# Patient Record
Sex: Female | Born: 1985 | Race: White | Hispanic: No | Marital: Married | Smoking: Never smoker
Health system: Southern US, Community
[De-identification: ages and names within clinical notes are randomized; demographics above are authoritative.]

## PROBLEM LIST (undated history)

## (undated) DIAGNOSIS — J45909 Unspecified asthma, uncomplicated: Secondary | ICD-10-CM

## (undated) DIAGNOSIS — N809 Endometriosis, unspecified: Secondary | ICD-10-CM

## (undated) DIAGNOSIS — R011 Cardiac murmur, unspecified: Secondary | ICD-10-CM

## (undated) DIAGNOSIS — R87619 Unspecified abnormal cytological findings in specimens from cervix uteri: Secondary | ICD-10-CM

## (undated) HISTORY — DX: Endometriosis, unspecified: N80.9

## (undated) HISTORY — PX: WISDOM TOOTH EXTRACTION: SHX21

## (undated) HISTORY — DX: Unspecified asthma, uncomplicated: J45.909

## (undated) HISTORY — DX: Cardiac murmur, unspecified: R01.1

## (undated) HISTORY — DX: Unspecified abnormal cytological findings in specimens from cervix uteri: R87.619

---
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
## 2007-06-09 ENCOUNTER — Encounter: Admission: RE | Admit: 2007-06-09 | Discharge: 2007-06-09 | Payer: Self-pay | Admitting: Internal Medicine

## 2010-09-13 NOTE — Op Note (Signed)
NAME:  Anne Hall, Anne Hall                         ACCOUNT NO.:  [id number]   MEDICAL RECORD NO.:  [id number]                   PATIENT TYPE:  AMB   LOCATION:  DSC                                  FACILITY:  [facility]   PHYSICIAN:  Hewitt Blade, D.D.S.             DATE OF BIRTH:  [birth year]   DATE OF PROCEDURE:  06/14/2002  DATE OF DISCHARGE:                                 OPERATIVE REPORT   PREOPERATIVE DIAGNOSES:  1. Maxillary and mandibular impacted third molar teeth #16, 17, 32.  2. History of asthma.   POSTOPERATIVE DIAGNOSES:  1. Maxillary and mandibular impacted third molar teeth #16, 17, 32.  2. History of asthma.   PROCEDURE:  Removal of third molar teeth #16, 17, 32.   SURGEON:  Hewitt Blade, D.D.S.   ASSISTANT:  Earlene Plater.   ANESTHESIA:  General via oroendotracheal intubation.   ESTIMATED BLOOD LOSS:  Less than 50 mL.   FLUIDS REPLACED:  Approximately 1000 mL crystalloid solution.   COMPLICATIONS:  None apparent.   INDICATION FOR PROCEDURE:  The patient is a 25 year old female who was  referred to my office for removal of her third molar teeth.  The patient was  attempted to have surgery in the office and was begun with nitrous oxide  oxygen sedation, then the patient became extremely asthmatic.  The procedure  was immediately terminated and the patient was rescheduled for day surgical  procedures where the airway could be properly maintained with intubation.   DESCRIPTION OF PROCEDURE:  The patient was taken to [facility] day  surgical center, where she was placed on the operating room table in a  supine position.  Following successful oroendotracheal intubation and  general anesthesia, the patient's face, neck, and oral cavity were prepped  and draped in the usual sterile operating room fashion.  The hypopharynx was  suctioned free of fluids and secretions and a moistened two-inch vaginal  pack was placed as a throat pack.  Attention was then directed  intraorally,  where approximately 10 mL of 0.5% Xylocaine containing 1:200,000 epinephrine  were infiltrated in the maxillary left posterior superior alveolar nerve  distribution, the corresponding palatal soft tissues, and the right and left  inferior alveolar neurovascular regions.  Attention was then directed toward  the left maxillary arch, where a #15 Bard Parker blade was used to create a  1.5 cm curvilinear incision through the alveolar mucosa.  A full-thickness  mucosal flap was elevated laterally and superiorly using a #9 Molt  periosteal elevator, exposing the lateral cortical plate of the posterior  maxilla.  A Stryker rotary osteotome with a #8 round bur was then used to  create an interosseous window, exposing embedded tooth #16.  The tooth was  then sectioned in its long axes using a 107 Fisher bur.  The tooth was then  subluxated from the alveolus and removed from the oral cavity using rongeurs  and cutting  forceps.  The surrounding dental follicular tissue was curetted  and removed using a double-ended Molt curette and rongeurs forceps.  The  bony margins were then smoothed and contoured with a small osseous file and  the surgical area copiously irrigated with sterile saline irrigating  solutions and suctioned. In a similar fashion, the lower third molar teeth  #17 and 32 were removed.  The mucoperiosteal margins were approximated and  sutured in an interrupted fashion using 4-0 chromic suture material.  The  patient was then allowed to awaken from the anesthesia following removal of  the throat pack and taken to the recovery room, where she tolerated the  procedure well without apparent complication.                                               Hewitt Blade, D.D.S.    DC/MEDQ  D:  06/14/2002  T:  06/14/2002  Job:  [id number]

## 2013-04-28 DIAGNOSIS — R87619 Unspecified abnormal cytological findings in specimens from cervix uteri: Secondary | ICD-10-CM

## 2013-04-28 HISTORY — DX: Unspecified abnormal cytological findings in specimens from cervix uteri: R87.619

## 2014-07-11 DIAGNOSIS — Z8742 Personal history of other diseases of the female genital tract: Secondary | ICD-10-CM | POA: Insufficient documentation

## 2015-02-05 ENCOUNTER — Encounter: Payer: Self-pay | Admitting: Physician Assistant

## 2015-02-05 ENCOUNTER — Ambulatory Visit: Payer: Self-pay | Admitting: Physician Assistant

## 2015-02-05 VITALS — BP 120/70 | HR 81 | Temp 99.2°F

## 2015-02-05 DIAGNOSIS — R509 Fever, unspecified: Secondary | ICD-10-CM

## 2015-02-05 DIAGNOSIS — J45990 Exercise induced bronchospasm: Secondary | ICD-10-CM

## 2015-02-05 LAB — POCT URINALYSIS DIPSTICK
Bilirubin, UA: NEGATIVE
Blood, UA: NEGATIVE
Glucose, UA: NEGATIVE
Ketones, UA: NEGATIVE
Leukocytes, UA: NEGATIVE
Nitrite, UA: NEGATIVE
Protein, UA: NEGATIVE
Spec Grav, UA: 1.01
Urobilinogen, UA: 0.2
pH, UA: 7

## 2015-02-05 MED ORDER — ALBUTEROL SULFATE HFA 108 (90 BASE) MCG/ACT IN AERS: 2.0000 | INHALATION_SPRAY | Freq: Four times a day (QID) | RESPIRATORY_TRACT | Status: DC | PRN

## 2015-02-05 NOTE — Progress Notes (Signed)
S: here for med refill, hx of exercise induced asthma, uses albuterol, no problems with asthma otherwise, also having some pelvic pain, was treated for uti few weeks ago, needs to see gyn  O: vitals wnl, nad, lungs c t a, cv rrr, ua wnl  A: exercise induced asthma, pelvic pain  P: f/u with gyn asap for pelvic pain, albuterol inhaler for asthma

## 2015-03-20 ENCOUNTER — Encounter: Payer: Self-pay | Admitting: Obstetrics and Gynecology

## 2015-10-23 ENCOUNTER — Ambulatory Visit: Payer: Self-pay | Admitting: Physician Assistant

## 2015-10-23 ENCOUNTER — Encounter: Payer: Self-pay | Admitting: Physician Assistant

## 2015-10-23 VITALS — BP 120/70 | HR 91 | Temp 99.6°F

## 2015-10-23 DIAGNOSIS — J029 Acute pharyngitis, unspecified: Secondary | ICD-10-CM

## 2015-10-23 MED ORDER — AZITHROMYCIN 250 MG PO TABS: ORAL_TABLET | ORAL | Status: DC

## 2015-10-23 NOTE — Progress Notes (Signed)
S: c/o sore throat, son has strep, she has a cold with congestion and cough for 3 days, throat started getting really sore overnight, has low grade temp, no tick bite  O: vitals w low grade temp, nad, tms clear, throat bright red, swollen, neck supple no lymph, lungs c t a, cv rrr  A: acute phayrngitis  P: zpack

## 2016-02-27 ENCOUNTER — Encounter: Payer: Self-pay | Admitting: Obstetrics and Gynecology

## 2016-02-27 ENCOUNTER — Ambulatory Visit (INDEPENDENT_AMBULATORY_CARE_PROVIDER_SITE_OTHER): Payer: Managed Care, Other (non HMO) | Admitting: Obstetrics and Gynecology

## 2016-02-27 VITALS — BP 131/81 | HR 93 | Ht 71.0 in | Wt 140.2 lb

## 2016-02-27 DIAGNOSIS — Z01419 Encounter for gynecological examination (general) (routine) without abnormal findings: Secondary | ICD-10-CM

## 2016-02-27 NOTE — Progress Notes (Signed)
ANNUAL PREVENTATIVE CARE GYN  ENCOUNTER NOTE  Subjective:       Anne Hall is a 30 y.o. G0P0000 female here for a routine annual gynecologic exam.  Current complaints: 1.  None  Patient has not been to the gynecologist in several years since her's retired. History of an abnormal pap smear, and then was told with the changes in protocol that pap would not be considered abnormal anymore. No new sexual partners, married and monogamous.   Menstrual history Menarche- 30 yo Interval- average 27 days Duration- 4 days Character: heavy for a couple days and then lighter Dysmenorrhea is mild cramping with occasional moderate burning cramps which she treats with advil    Gynecologic History Patient's last menstrual period was 02/21/2016 (exact date). Contraception: none Last Pap: 2014. Results were: normal Last mammogram: n/a. Results were: Marland Kitchen.  Obstetric History OB History  Gravida Para Term Preterm AB Living  0 0 0 0 0 0  SAB TAB Ectopic Multiple Live Births  0 0 0 0 0        Past Medical History:  Diagnosis Date  . Abnormal Pap smear of cervix 2015    Past Surgical History:  Procedure Laterality Date  . WISDOM TOOTH EXTRACTION      No current outpatient prescriptions on file prior to visit.   No current facility-administered medications on file prior to visit.     No Known Allergies  Social History   Social History  . Marital status: Married    Spouse name: N/A  . Number of children: N/A  . Years of education: N/A   Occupational History  . Not on file.   Social History Main Topics  . Smoking status: Never Smoker  . Smokeless tobacco: Not on file  . Alcohol use Not on file  . Drug use: Unknown  . Sexual activity: Not on file   Other Topics Concern  . Not on file   Social History Narrative  . No narrative on file    No family history on file.  The following portions of the patient's history were reviewed and updated as appropriate: allergies, current  medications, past family history, past medical history, past social history, past surgical history and problem list.  Review of Systems ROS Review of Systems - General ROS: negative for - chills, fatigue, fever, hot flashes, night sweats, weight gain or weight loss Psychological ROS: negative for - anxiety, decreased libido, depression, mood swings, physical abuse or sexual abuse Ophthalmic ROS: negative for - blurry vision, eye pain or loss of vision ENT ROS: negative for - headaches, hearing change, visual changes or vocal changes Allergy and Immunology ROS: negative for - hives, itchy/watery eyes or seasonal allergies Hematological and Lymphatic ROS: negative for - bleeding problems, bruising, swollen lymph nodes or weight loss Endocrine ROS: negative for - galactorrhea, hair pattern changes, hot flashes, malaise/lethargy, mood swings, palpitations, polydipsia/polyuria, skin changes, temperature intolerance or unexpected weight changes Breast ROS: negative for - new or changing breast lumps or nipple discharge Respiratory ROS: negative for - cough or shortness of breath Cardiovascular ROS: negative for - chest pain, irregular heartbeat, palpitations or shortness of breath Gastrointestinal ROS: no abdominal pain, change in bowel habits, or black or bloody stools Genito-Urinary ROS: no dysuria, trouble voiding, or hematuria Musculoskeletal ROS: negative for - joint pain or joint stiffness Neurological ROS: negative for - bowel and bladder control changes Dermatological ROS: negative for rash and skin lesion changes   Objective:   BP 131/81  Pulse 93   Ht 5\' 11"  (1.803 m)   Wt 140 lb 3.2 oz (63.6 kg)   LMP 02/21/2016 (Exact Date)   BMI 19.55 kg/m  CONSTITUTIONAL: Well-developed, well-nourished female in no acute distress.  PSYCHIATRIC: Normal mood and affect. Normal behavior. Normal judgment and thought content. NEUROLGIC: Alert and oriented to person, place, and time. Normal muscle  tone coordination. No cranial nerve deficit noted. HENT:  Normocephalic, atraumatic, External right and left ear normal. Oropharynx is clear and moist EYES: Conjunctivae and EOM are normal.  No scleral icterus.  NECK: Normal range of motion, supple, no masses.  Normal thyroid.  SKIN: Skin is warm and dry. No rash noted. Not diaphoretic. No erythema. No pallor. CARDIOVASCULAR: Normal heart rate noted, regular rhythm, no murmur. RESPIRATORY: Clear to auscultation bilaterally. Effort and breath sounds normal, no problems with respiration noted. BREASTS: Symmetric in size. No masses, skin changes, nipple drainage, or lymphadenopathy. ABDOMEN: Soft, normal bowel sounds, no distention noted.  No tenderness, rebound or guarding.  BLADDER: Normal PELVIC:  External Genitalia: Normal  BUS: Normal  Vagina: Normal  Cervix: Normal  Uterus: anteverted, soft, small, mobile and nontender  Adnexa: Normal; ovaries nonpalpable  LYMPHATIC: No Axillary, Supraclavicular, or Inguinal Adenopathy.    Assessment:   Annual gynecologic examination 30 y.o. 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 and unable to conceive, has not pursued infertility counseling, is currently taking a multivitamin with folic acid and open to pregnancy Family history of breast cancer: maternal grandmother- age 371 when diagnosed 19   Plan:  Pap: Pap Co Test Mammogram: Not Indicated Stool Guaiac Testing:  Not Indicated Labs: declines Routine preventative health maintenance measures emphasized: Exercise/Diet/Weight control, Tobacco Warnings, Alcohol/Substance use risks and Safe Sex  Continue taking MTV with folic acid while not using contraception  Return to Clinic - 1 Year   SunGardCrystal Miller, [place] Corena HerterAnna Parr, PA-S Herold HarmsMartin A Tamaka Sawin, MD   I have seen, interviewed, and examined the patient in conjunction with the [facility] [employer] and affirm the diagnosis and management  plan. Jidenna Figgs A. Georgette Helmer, MD, FACOG   Note: This dictation was prepared with Dragon dictation along with smaller phrase technology. Any transcriptional errors that result from this process are unintentional.

## 2016-02-28 DIAGNOSIS — J45990 Exercise induced bronchospasm: Secondary | ICD-10-CM | POA: Insufficient documentation

## 2016-02-28 NOTE — Patient Instructions (Signed)
Health Maintenance, Female Adopting a healthy lifestyle and getting preventive care can go a long way to promote health and wellness. Talk with your health care provider about what schedule of regular examinations is right for you. This is a good chance for you to check in with your provider about disease prevention and staying healthy. In between checkups, there are plenty of things you can do on your own. Experts have done a lot of research about which lifestyle changes and preventive measures are most likely to keep you healthy. Ask your health care provider for more information. WEIGHT AND DIET  Eat a healthy diet  Be sure to include plenty of vegetables, fruits, low-fat dairy products, and lean protein.  Do not eat a lot of foods high in solid fats, added sugars, or salt.  Get regular exercise. This is one of the most important things you can do for your health.  Most adults should exercise for at least 150 minutes each week. The exercise should increase your heart rate and make you sweat (moderate-intensity exercise).  Most adults should also do strengthening exercises at least twice a week. This is in addition to the moderate-intensity exercise.  Maintain a healthy weight  Body mass index (BMI) is a measurement that can be used to identify possible weight problems. It estimates body fat based on height and weight. Your health care provider can help determine your BMI and help you achieve or maintain a healthy weight.  For females 20 years of age and older:   A BMI below 18.5 is considered underweight.  A BMI of 18.5 to 24.9 is normal.  A BMI of 25 to 29.9 is considered overweight.  A BMI of 30 and above is considered obese.  Watch levels of cholesterol and blood lipids  You should start having your blood tested for lipids and cholesterol at 30 years of age, then have this test every 5 years.  You may need to have your cholesterol levels checked more often if:  Your lipid  or cholesterol levels are high.  You are older than 30 years of age.  You are at high risk for heart disease.  CANCER SCREENING   Lung Cancer  Lung cancer screening is recommended for adults 55-80 years old who are at high risk for lung cancer because of a history of smoking.  A yearly low-dose CT scan of the lungs is recommended for people who:  Currently smoke.  Have quit within the past 15 years.  Have at least a 30-pack-year history of smoking. A pack year is smoking an average of one pack of cigarettes a day for 1 year.  Yearly screening should continue until it has been 15 years since you quit.  Yearly screening should stop if you develop a health problem that would prevent you from having lung cancer treatment.  Breast Cancer  Practice breast self-awareness. This means understanding how your breasts normally appear and feel.  It also means doing regular breast self-exams. Let your health care provider know about any changes, no matter how small.  If you are in your 20s or 30s, you should have a clinical breast exam (CBE) by a health care provider every 1-3 years as part of a regular health exam.  If you are 40 or older, have a CBE every year. Also consider having a breast X-ray (mammogram) every year.  If you have a family history of breast cancer, talk to your health care provider about genetic screening.  If you   are at high risk for breast cancer, talk to your health care provider about having an MRI and a mammogram every year.  Breast cancer gene (BRCA) assessment is recommended for women who have family members with BRCA-related cancers. BRCA-related cancers include:  Breast.  Ovarian.  Tubal.  Peritoneal cancers.  Results of the assessment will determine the need for genetic counseling and BRCA1 and BRCA2 testing. Cervical Cancer Your health care provider may recommend that you be screened regularly for cancer of the pelvic organs (ovaries, uterus, and  vagina). This screening involves a pelvic examination, including checking for microscopic changes to the surface of your cervix (Pap test). You may be encouraged to have this screening done every 3 years, beginning at age 28.  For women ages 62-65, health care providers may recommend pelvic exams and Pap testing every 3 years, or they may recommend the Pap and pelvic exam, combined with testing for human papilloma virus (HPV), every 5 years. Some types of HPV increase your risk of cervical cancer. Testing for HPV may also be done on women of any age with unclear Pap test results.  Other health care providers may not recommend any screening for nonpregnant women who are considered low risk for pelvic cancer and who do not have symptoms. Ask your health care provider if a screening pelvic exam is right for you.  If you have had past treatment for cervical cancer or a condition that could lead to cancer, you need Pap tests and screening for cancer for at least 20 years after your treatment. If Pap tests have been discontinued, your risk factors (such as having a new sexual partner) need to be reassessed to determine if screening should resume. Some women have medical problems that increase the chance of getting cervical cancer. In these cases, your health care provider may recommend more frequent screening and Pap tests. Colorectal Cancer  This type of cancer can be detected and often prevented.  Routine colorectal cancer screening usually begins at 30 years of age and continues through 30 years of age.  Your health care provider may recommend screening at an earlier age if you have risk factors for colon cancer.  Your health care provider may also recommend using home test kits to check for hidden blood in the stool.  A small camera at the end of a tube can be used to examine your colon directly (sigmoidoscopy or colonoscopy). This is done to check for the earliest forms of colorectal  cancer.  Routine screening usually begins at age 86.  Direct examination of the colon should be repeated every 5-10 years through 30 years of age. However, you may need to be screened more often if early forms of precancerous polyps or small growths are found. Skin Cancer  Check your skin from head to toe regularly.  Tell your health care provider about any new moles or changes in moles, especially if there is a change in a mole's shape or color.  Also tell your health care provider if you have a mole that is larger than the size of a pencil eraser.  Always use sunscreen. Apply sunscreen liberally and repeatedly throughout the day.  Protect yourself by wearing long sleeves, pants, a wide-brimmed hat, and sunglasses whenever you are outside. HEART DISEASE, DIABETES, AND HIGH BLOOD PRESSURE   High blood pressure causes heart disease and increases the risk of stroke. High blood pressure is more likely to develop in:  People who have blood pressure in the high end  of the normal range (130-139/85-89 mm Hg).  People who are overweight or obese.  People who are African American.  If you are 38-23 years of age, have your blood pressure checked every 3-5 years. If you are 61 years of age or older, have your blood pressure checked every year. You should have your blood pressure measured twice--once when you are at a hospital or clinic, and once when you are not at a hospital or clinic. Record the average of the two measurements. To check your blood pressure when you are not at a hospital or clinic, you can use:  An automated blood pressure machine at a pharmacy.  A home blood pressure monitor.  If you are between 45 years and 39 years old, ask your health care provider if you should take aspirin to prevent strokes.  Have regular diabetes screenings. This involves taking a blood sample to check your fasting blood sugar level.  If you are at a normal weight and have a low risk for diabetes,  have this test once every three years after 30 years of age.  If you are overweight and have a high risk for diabetes, consider being tested at a younger age or more often. PREVENTING INFECTION  Hepatitis B  If you have a higher risk for hepatitis B, you should be screened for this virus. You are considered at high risk for hepatitis B if:  You were born in a country where hepatitis B is common. Ask your health care provider which countries are considered high risk.  Your parents were born in a high-risk country, and you have not been immunized against hepatitis B (hepatitis B vaccine).  You have HIV or AIDS.  You use needles to inject street drugs.  You live with someone who has hepatitis B.  You have had sex with someone who has hepatitis B.  You get hemodialysis treatment.  You take certain medicines for conditions, including cancer, organ transplantation, and autoimmune conditions. Hepatitis C  Blood testing is recommended for:  Everyone born from [birth year] through 1965.  Anyone with known risk factors for hepatitis C. Sexually transmitted infections (STIs)  You should be screened for sexually transmitted infections (STIs) including gonorrhea and chlamydia if:  You are sexually active and are younger than 30 years of age.  You are older than 30 years of age and your health care provider tells you that you are at risk for this type of infection.  Your sexual activity has changed since you were last screened and you are at an increased risk for chlamydia or gonorrhea. Ask your health care provider if you are at risk.  If you do not have HIV, but are at risk, it may be recommended that you take a prescription medicine daily to prevent HIV infection. This is called pre-exposure prophylaxis (PrEP). You are considered at risk if:  You are sexually active and do not regularly use condoms or know the HIV status of your partner(s).  You take drugs by injection.  You are sexually  active with a partner who has HIV. Talk with your health care provider about whether you are at high risk of being infected with HIV. If you choose to begin PrEP, you should first be tested for HIV. You should then be tested every 3 months for as long as you are taking PrEP.  PREGNANCY   If you are premenopausal and you may become pregnant, ask your health care provider about preconception counseling.  If you may  become pregnant, take 400 to 800 micrograms (mcg) of folic acid every day.  If you want to prevent pregnancy, talk to your health care provider about birth control (contraception). OSTEOPOROSIS AND MENOPAUSE   Osteoporosis is a disease in which the bones lose minerals and strength with aging. This can result in serious bone fractures. Your risk for osteoporosis can be identified using a bone density scan.  If you are 61 years of age or older, or if you are at risk for osteoporosis and fractures, ask your health care provider if you should be screened.  Ask your health care provider whether you should take a calcium or vitamin D supplement to lower your risk for osteoporosis.  Menopause may have certain physical symptoms and risks.  Hormone replacement therapy may reduce some of these symptoms and risks. Talk to your health care provider about whether hormone replacement therapy is right for you.  HOME CARE INSTRUCTIONS   Schedule regular health, dental, and eye exams.  Stay current with your immunizations.   Do not use any tobacco products including cigarettes, chewing tobacco, or electronic cigarettes.  If you are pregnant, do not drink alcohol.  If you are breastfeeding, limit how much and how often you drink alcohol.  Limit alcohol intake to no more than 1 drink per day for nonpregnant women. One drink equals 12 ounces of beer, 5 ounces of wine, or 1 ounces of hard liquor.  Do not use street drugs.  Do not share needles.  Ask your health care provider for help if  you need support or information about quitting drugs.  Tell your health care provider if you often feel depressed.  Tell your health care provider if you have ever been abused or do not feel safe at home.   This information is not intended to replace advice given to you by your health care provider. Make sure you discuss any questions you have with your health care provider.   Document Released: 10/28/2010 Document Revised: 05/05/2014 Document Reviewed: 03/16/2013 Elsevier Interactive Patient Education [employer].

## 2016-02-29 ENCOUNTER — Telehealth: Payer: Self-pay | Admitting: Obstetrics and Gynecology

## 2016-02-29 NOTE — Telephone Encounter (Signed)
PT CALLED AND SHE SAW DR DE AND HE TOLD HER THAT HER HUSBAND NEEDED TO BE TESTED FOR FERTILITY AND SHEW WASN'T SURE WHERE HE NEEDED TO GO AND GT THAT DONE AND IF IT WAS BLOOD OR SEMEN.

## 2016-03-03 LAB — PAP IG AND HPV HIGH-RISK
HPV, high-risk: NEGATIVE
PAP Smear Comment: 0

## 2016-03-03 NOTE — Telephone Encounter (Signed)
Lmtrc

## 2016-03-04 NOTE — Telephone Encounter (Signed)
Pt aware per mad- husband to complete semen analysis. All info explained and left up front for p/u.

## 2016-10-09 ENCOUNTER — Ambulatory Visit: Payer: Self-pay | Admitting: Physician Assistant

## 2016-10-09 ENCOUNTER — Encounter: Payer: Self-pay | Admitting: Physician Assistant

## 2016-10-09 VITALS — BP 100/70 | HR 68 | Temp 98.5°F | Resp 16

## 2016-10-09 DIAGNOSIS — R109 Unspecified abdominal pain: Secondary | ICD-10-CM

## 2016-10-09 DIAGNOSIS — Z299 Encounter for prophylactic measures, unspecified: Secondary | ICD-10-CM

## 2016-10-09 LAB — POCT URINALYSIS DIPSTICK
Bilirubin, UA: NEGATIVE
Blood, UA: NEGATIVE
Glucose, UA: NEGATIVE
Ketones, UA: NEGATIVE
Leukocytes, UA: NEGATIVE
Nitrite, UA: NEGATIVE
Protein, UA: NEGATIVE
Spec Grav, UA: 1.01 (ref 1.010–1.025)
Urobilinogen, UA: 0.2 E.U./dL
pH, UA: 7 (ref 5.0–8.0)

## 2016-10-09 NOTE — Progress Notes (Signed)
   Subjective:Righ flank pain    Patient ID: Anne Hall, female    DOB: 1985/09/03, 31 y.o.   MRN: [id number]  HPI Patient c/o right intermitting right flank pain for 1 1/2 weeks. Denies urinary compliant.Denies fever or vaginal discharge. LMP one week ago. States only provocative incident is carrying chil car seat for 415 month old infant. No radicular compliant.   Review of Systems    Negative except for compliant. Objective:   Physical Exam No obvious spinal deformity. No CVA guarding. Increased c/o pain with left lateral movements. Dip UA unremarkable.       Assessment & Plan:Left flank pain.  Discussed conservative measure at this time. Follow up if compliant presists.

## 2017-02-04 ENCOUNTER — Ambulatory Visit: Payer: Self-pay | Admitting: Physician Assistant

## 2017-02-04 ENCOUNTER — Encounter: Payer: Self-pay | Admitting: Physician Assistant

## 2017-02-04 VITALS — BP 119/62 | HR 70 | Temp 98.5°F | Resp 16

## 2017-02-04 DIAGNOSIS — Z0289 Encounter for other administrative examinations: Secondary | ICD-10-CM

## 2017-02-04 NOTE — Progress Notes (Signed)
Pt here to have form filled out for foster care.  No chronic problems, no tb symptoms, is not immunocompromised, is in good health, states she is renewing her license  O: vitals wnl, nad, lungs c t a, cv rrr  A: well adult  P: filled out form for pt, rma to scan form

## 2017-08-27 ENCOUNTER — Ambulatory Visit: Payer: Self-pay | Admitting: Family Medicine

## 2017-08-27 VITALS — BP 125/62 | HR 68 | Resp 15 | Ht 71.0 in | Wt 143.0 lb

## 2017-08-27 DIAGNOSIS — Z008 Encounter for other general examination: Secondary | ICD-10-CM

## 2017-08-27 DIAGNOSIS — J45909 Unspecified asthma, uncomplicated: Secondary | ICD-10-CM

## 2017-08-27 DIAGNOSIS — R221 Localized swelling, mass and lump, neck: Secondary | ICD-10-CM

## 2017-08-27 DIAGNOSIS — Z0189 Encounter for other specified special examinations: Principal | ICD-10-CM

## 2017-08-27 LAB — GLUCOSE, POCT (MANUAL RESULT ENTRY): POC Glucose: 84 mg/dl (ref 70–99)

## 2017-08-27 MED ORDER — ALBUTEROL SULFATE HFA 108 (90 BASE) MCG/ACT IN AERS: 2.0000 | INHALATION_SPRAY | Freq: Four times a day (QID) | RESPIRATORY_TRACT | 1 refills | Status: DC | PRN

## 2017-08-27 NOTE — Progress Notes (Signed)
Subjective: Annual biometrics screening  Patient presents for her annual biometric screening. Patient reports that her only medical history is asthma and is requesting a refill for her albuterol inhaler.  Patient reports she only uses her albuterol inhaler prior to running.  Patient denies using this at all otherwise.  Patient does not currently have a primary care provider.  Patient denies any limitations related to asthma or any history of asthma exacerbations.  Patient also reports noting a bump to the right side of her neck for the last 1 to 2 months.  Patient reports having an upper respiratory infection prior to this but is unsure if it coincided with the onset of the bump to the right side of her neck.  Patient denies any tenderness or change in this nodule.  Reports is remained stable.  Patient denies any other nodules or symptoms.  Patient denies any relevant infections, travel, or medical history.  Patient denies fever, chills, weight loss, fatigue, malaise, or any other systemic symptoms.  Denies any history of cancer. Patient denies any other issues or concerns.   Review of Systems Unremarkable  Objective  Physical Exam General: Awake, alert and oriented. No acute distress. Well developed, hydrated and nourished. Appears stated age.  HEENT: Supple neck without adenopathy. Sclera is non-icteric. The ear canal is clear without discharge. The tympanic membrane is normal in appearance with normal landmarks and cone of light. Nasal mucosa is pink and moist. Oral mucosa is pink and moist. The pharynx is normal in appearance without tonsillar swelling or exudates.  Skin: Skin in warm, dry and intact without rashes or lesions. Appropriate color for ethnicity.  Small soft, mobile nodule approximately half a centimeter in diameter along the right sternocleidomastoid muscle overlying anterior cervical lymph chain.  Nontender to palpation.  No erythema, edema, warmth to the touch, fluctuance, or  concerning findings noted. Cardiac: Heart rate and rhythm are normal. No murmurs, gallops, or rubs are auscultated.  Respiratory: The chest wall is symmetric and without deformity. No signs of respiratory distress. Lung sounds are clear in all lobes bilaterally without rales, ronchi, or wheezes.  Neurological: The patient is awake, alert and oriented to person, place, and time with normal speech.  Memory is normal and thought processes intact. No gait abnormalities are appreciated.  Psychiatric: Appropriate mood and affect.   Assessment Annual biometrics screening  Plan  Lipid panel pending. Encouraged routine visits with primary care provider.  Encourage patient to establish care with a new primary care provider. Fasting blood sugar is 84 today. Ultrasound ordered to assess nodule to right neck.  Patient says she is unable to get this done until next week.

## 2017-08-28 LAB — LIPID PANEL
Chol/HDL Ratio: 2.4 ratio (ref 0.0–4.4)
Cholesterol, Total: 141 mg/dL (ref 100–199)
HDL: 60 mg/dL (ref >39)
LDL Calculated: 72 mg/dL (ref 0–99)
Triglycerides: 43 mg/dL (ref 0–149)
VLDL Cholesterol Cal: 9 mg/dL (ref 5–40)

## 2017-09-03 ENCOUNTER — Ambulatory Visit
Admission: RE | Admit: 2017-09-03 | Discharge: 2017-09-03 | Disposition: A | Discharge status: Home / Self Care | Payer: Managed Care, Other (non HMO) | Source: Ambulatory Visit | Attending: Family Medicine | Admitting: Family Medicine

## 2017-09-03 DIAGNOSIS — R221 Localized swelling, mass and lump, neck: Secondary | ICD-10-CM | POA: Insufficient documentation

## 2017-09-03 NOTE — Progress Notes (Signed)
Called and spoke with the patient and informed her of her ultrasound results.  Patient reports the nodule is unchanged and denies any new symptoms.  Advised patient to call make an appointment with a new primary care provider today for an appointment within the next month to establish care and to follow-up with her primary care provider at this first visit if the nodule is still present.  Advised ptient to return to the clinic if she notices any changes, develops new symptoms, or has any concerns.

## 2018-02-05 ENCOUNTER — Encounter: Payer: Self-pay | Admitting: Family Medicine

## 2018-02-05 ENCOUNTER — Ambulatory Visit (INDEPENDENT_AMBULATORY_CARE_PROVIDER_SITE_OTHER): Payer: Managed Care, Other (non HMO) | Admitting: Family Medicine

## 2018-02-05 VITALS — BP 116/80 | HR 64 | Temp 98.8°F | Ht 69.5 in | Wt 146.6 lb

## 2018-02-05 DIAGNOSIS — J4530 Mild persistent asthma, uncomplicated: Secondary | ICD-10-CM | POA: Diagnosis not present

## 2018-02-05 MED ORDER — ALBUTEROL SULFATE HFA 108 (90 BASE) MCG/ACT IN AERS: 2.0000 | INHALATION_SPRAY | Freq: Four times a day (QID) | RESPIRATORY_TRACT | 5 refills | Status: AC | PRN

## 2018-02-05 MED ORDER — MONTELUKAST SODIUM 10 MG PO TABS: 10.0000 mg | ORAL_TABLET | Freq: Every day | ORAL | 3 refills | Status: DC

## 2018-02-05 MED ORDER — FLUTICASONE-SALMETEROL 100-50 MCG/DOSE IN AEPB: 1.0000 | INHALATION_SPRAY | Freq: Two times a day (BID) | RESPIRATORY_TRACT | 5 refills | Status: DC

## 2018-02-05 NOTE — Progress Notes (Signed)
Subjective:    Patient ID: Anne Hall, female    DOB: 11/08/1985, 32 y.o.   MRN: [id number]  HPI   Patient presents to clinic to establish primary care and also needs asthma medication adjustment.  Currently she uses albuterol inhaler as needed, but has noticed she is been needing it more.  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  Notices her breathing seems worse when running outdoors, so has been using treadmill indoors more. She is training for a marathon that will take place in March 2020 and wants to be sure she is able to run outdoors. States she did take Advair for a period of time when in highschool, but then stopped taking it in college.    Patient Active Problem List   Diagnosis Date Noted  . Exercise-induced asthma 02/28/2016  . History of infertility 07/11/2014   Past Medical History:  Diagnosis Date  . Abnormal Pap smear of cervix 2015  . Asthma   . Endometriosis   . Heart murmur    Past Surgical History:  Procedure Laterality Date  . WISDOM TOOTH EXTRACTION     Social History   Tobacco Use  . Smoking status: Never Smoker  . Smokeless tobacco: Never Used  Substance Use Topics  . Alcohol use: Not Currently    Alcohol/week: 0.0 standard drinks    Comment: wine- 2 x week   Family History  Problem Relation Age of Onset  . Cancer Maternal Grandmother   . Heart disease Maternal Grandmother   . Miscarriages / [place] Mother   . Hyperlipidemia Mother   . Hypertension Mother   . Asthma Father   . Arthritis Paternal Grandmother   . Kidney disease Paternal Grandmother   . Alcohol abuse Paternal Grandfather   . Early death Paternal Grandfather   . Miscarriages / Stillbirths Sister   . Diabetes Neg Hx     Review of Systems    Constitutional: Negative for chills, fatigue and fever.  HENT: Negative for congestion, ear pain, sinus pain and sore throat.   Eyes: Negative.   Respiratory: Negative for cough. +SOB and wheeze, esp when running.     Cardiovascular: Negative for chest pain, palpitations and leg swelling.  Gastrointestinal: Negative for abdominal pain, diarrhea, nausea and vomiting.  Genitourinary: Negative for dysuria, frequency and urgency.  Musculoskeletal: Negative for arthralgias and myalgias.  Skin: Negative for color change, pallor and rash.  Neurological: Negative for syncope, light-headedness and headaches.  Psychiatric/Behavioral: The patient is not nervous/anxious.       Objective:   Physical Exam  Constitutional: She is oriented to person, place, and time. She appears well-developed and well-nourished. No distress.  Eyes: Conjunctivae and EOM are normal. No scleral icterus.  Neck: Neck supple. No tracheal deviation present.  Cardiovascular: Normal rate and regular rhythm.  Pulmonary/Chest: Effort normal and breath sounds normal. No respiratory distress. She has no wheezes. She has no rales.  Musculoskeletal: She exhibits no edema.  Gait normal.   Neurological: She is alert and oriented to person, place, and time.  Skin: Skin is warm and dry. Capillary refill takes less than 2 seconds. No pallor.  Psychiatric: She has a normal mood and affect. Her behavior is normal.  Nursing note and vitals reviewed.     Vitals:   02/05/18 0957  BP: 116/80  Pulse: 64  Temp: 98.8 F (37.1 C)  SpO2: 99%    Assessment & Plan:   Mild persistent asthma - patient will continue to use  albuterol as needed.  We will also add an Advair 1 puff 2 times daily (patient has had success with this medication in past).  Due to patient running outside being a trigger for her asthma symptoms, I suspect allergens are triggering her symptoms.  She will begin Singulair 10 mg once daily to help combat this.  Patient declines flu vaccine in clinic today.  She declines tetanus vaccine in clinic today.  She will follow up in 4 weeks for recheck on breathing after medication additions.

## 2018-03-05 ENCOUNTER — Ambulatory Visit: Payer: Managed Care, Other (non HMO) | Admitting: Family Medicine

## 2018-03-10 ENCOUNTER — Encounter: Payer: Self-pay | Admitting: Family Medicine

## 2018-03-10 ENCOUNTER — Ambulatory Visit (INDEPENDENT_AMBULATORY_CARE_PROVIDER_SITE_OTHER): Payer: Managed Care, Other (non HMO) | Admitting: Family Medicine

## 2018-03-10 VITALS — BP 118/80 | HR 71 | Temp 98.3°F | Ht 70.0 in | Wt 144.0 lb

## 2018-03-10 DIAGNOSIS — Z0189 Encounter for other specified special examinations: Secondary | ICD-10-CM

## 2018-03-10 DIAGNOSIS — J4520 Mild intermittent asthma, uncomplicated: Secondary | ICD-10-CM

## 2018-03-10 DIAGNOSIS — Z008 Encounter for other general examination: Secondary | ICD-10-CM

## 2018-03-10 LAB — LIPID PANEL
Cholesterol: 158 mg/dL (ref 0–200)
HDL: 59.5 mg/dL (ref >39.00)
LDL Cholesterol: 89 mg/dL (ref 0–99)
NonHDL: 98.69
Total CHOL/HDL Ratio: 3
Triglycerides: 49 mg/dL (ref 0.0–149.0)
VLDL: 9.8 mg/dL (ref 0.0–40.0)

## 2018-03-10 LAB — GLUCOSE, POCT (MANUAL RESULT ENTRY): POC Glucose: 138 mg/dl — AB (ref 70–99)

## 2018-03-10 NOTE — Progress Notes (Signed)
Subjective:    Patient ID: Anne Hall, female    DOB: [birth year], 32 y.o.   MRN: [id number]  HPI  Presents to clinic for biometric screening for her insurance through her employer.  Patient is feeling very well.  Ever since the addition of Advair, she has not needed to use her albuterol inhaler at all in the past 4 weeks, even when doing a trail run.  Patient stopped the Singulair, states she did not like how it made her feel.  Patient does not require Pap smear at this time.  Does have a family history of breast cancer in her grandmother, but she was diagnosed at age 15.  Patient does do self breast exams, has no abnormalities.  Menses are regular.  Patient follows a healthy diet, she gets regular exercise.  She sees dentist 2 times per year, sees eye doctor every 1 to 2 years.   Patient Active Problem List   Diagnosis Date Noted  . Exercise-induced asthma 02/28/2016  . History of infertility 07/11/2014   Past Medical History:  Diagnosis Date  . Abnormal Pap smear of cervix 2015  . Asthma   . Endometriosis   . Heart murmur    Social History   Tobacco Use  . Smoking status: Never Smoker  . Smokeless tobacco: Never Used  Substance Use Topics  . Alcohol use: Not Currently    Alcohol/week: 0.0 standard drinks    Comment: wine- 2 x week   Past Surgical History:  Procedure Laterality Date  . WISDOM TOOTH EXTRACTION     Family History  Problem Relation Age of Onset  . Cancer Maternal Grandmother   . Heart disease Maternal Grandmother   . Miscarriages / [place] Mother   . Hyperlipidemia Mother   . Hypertension Mother   . Asthma Father   . Arthritis Paternal Grandmother   . Kidney disease Paternal Grandmother   . Alcohol abuse Paternal Grandfather   . Early death Paternal Grandfather   . Miscarriages / Stillbirths Sister   . Diabetes Neg Hx     Review of Systems  Constitutional: Negative for chills, fatigue and fever.  HENT: Negative for congestion,  ear pain, sinus pain and sore throat.   Eyes: Negative.   Respiratory: Negative for cough, shortness of breath and wheezing.   Cardiovascular: Negative for chest pain, palpitations and leg swelling.  Gastrointestinal: Negative for abdominal pain, diarrhea, nausea and vomiting.  Genitourinary: Negative for dysuria, frequency and urgency.  Musculoskeletal: Negative for arthralgias and myalgias.  Skin: Negative for color change, pallor and rash.  Neurological: Negative for syncope, light-headedness and headaches.  Psychiatric/Behavioral: The patient is not nervous/anxious.       Objective:   Physical Exam  Constitutional: She appears well-developed and well-nourished. No distress.  HENT:  Head: Normocephalic and atraumatic.  Eyes: Pupils are equal, round, and reactive to light. EOM are normal. No scleral icterus.  Neck: Normal range of motion. Neck supple. No tracheal deviation present.  Cardiovascular: Normal rate, regular rhythm and normal heart sounds.  Pulmonary/Chest: Effort normal and breath sounds normal. No respiratory distress. She has no wheezes. She has no rales.  Abdominal: Soft. Bowel sounds are normal. There is no tenderness.  Neurological: She is alert and oriented to person, place, and time.  Gait normal  Skin: Skin is warm and dry. No pallor.  Psychiatric: She has a normal mood and affect. Her behavior is normal. Thought content normal.   Nursing note and vitals reviewed.    Vitals:  03/10/18 0823  BP: 118/80  Pulse: 71  Temp: 98.3 F (36.8 C)  SpO2: 98%   Assessment & Plan:   Encounter for biometric screening- patient only would like lipid panel drawn in clinic today, this is the only lab test as required for the screening.  We will get this test ordered for her.  Biometric screening also requires fingerstick blood sugar, her result from our point-of-care testing in clinic is 138 (she did have small breakfast this AM before appt).  Asthma, mild - patient has  done very well with addition of Advair.  She will continue to take this, and also have albuterol inhaler to use as needed if she has any breakthrough shortness of breath or wheezing symptoms.  Patient will follow-up in 6 months for recheck on asthma.  She is aware she can return to clinic at any time if issues arise.

## 2018-03-11 ENCOUNTER — Telehealth: Payer: Self-pay | Admitting: Family Medicine

## 2018-03-11 NOTE — Telephone Encounter (Signed)
Copied from CRM [id number]. Topic: Quick Communication - Lab Results (Clinic Use ONLY) >> Mar 11, 2018  2:15 PM Clearnce SorrelPickard, Jill S, [place] wrote: Called patient to inform them of 03/09/2018 lab results. When patient returns call, triage nurse may disclose results.

## 2018-03-11 NOTE — Telephone Encounter (Signed)
See result notes. 

## 2018-05-28 ENCOUNTER — Telehealth: Payer: Self-pay | Admitting: Family Medicine

## 2018-05-28 NOTE — Telephone Encounter (Signed)
Pt left a form to be filled out for work. Form is in the front in [facility]'s folder.

## 2018-05-31 NOTE — Telephone Encounter (Signed)
Have you seen this form?

## 2018-06-01 NOTE — Telephone Encounter (Signed)
Picked up form from front

## 2018-07-01 ENCOUNTER — Ambulatory Visit: Payer: Managed Care, Other (non HMO) | Admitting: Adult Health

## 2018-07-01 ENCOUNTER — Other Ambulatory Visit: Payer: Self-pay

## 2018-07-01 ENCOUNTER — Encounter: Payer: Self-pay | Admitting: Adult Health

## 2018-07-01 VITALS — BP 122/78 | HR 69 | Temp 98.9°F | Resp 14 | Ht 71.0 in | Wt 140.0 lb

## 2018-07-01 DIAGNOSIS — J45909 Unspecified asthma, uncomplicated: Secondary | ICD-10-CM

## 2018-07-01 DIAGNOSIS — J020 Streptococcal pharyngitis: Secondary | ICD-10-CM

## 2018-07-01 LAB — POCT RAPID STREP A (OFFICE): Rapid Strep A Screen: POSITIVE — AB

## 2018-07-01 MED ORDER — CETIRIZINE HCL 10 MG PO TABS: 10.0000 mg | ORAL_TABLET | Freq: Every day | ORAL | 3 refills | Status: DC

## 2018-07-01 MED ORDER — AMOXICILLIN 875 MG PO TABS: 875.0000 mg | ORAL_TABLET | Freq: Two times a day (BID) | ORAL | 0 refills | Status: DC

## 2018-07-01 MED ORDER — PREDNISONE 10 MG (21) PO TBPK: ORAL_TABLET | ORAL | 0 refills | Status: DC

## 2018-07-01 MED ORDER — FLUTICASONE PROPIONATE 50 MCG/ACT NA SUSP: 2.0000 | Freq: Every day | NASAL | 3 refills | Status: DC

## 2018-07-01 NOTE — Patient Instructions (Addendum)
Prednisolone tablets What is this medicine? PREDNISOLONE (pred NISS oh lone) is a corticosteroid. It is commonly used to treat inflammation of the skin, joints, lungs, and other organs. Common conditions treated include asthma, allergies, and arthritis. It is also used for other conditions, such as blood disorders and diseases of the adrenal glands. This medicine may be used for other purposes; ask your health care provider or pharmacist if you have questions. COMMON BRAND NAME(S): Millipred, Millipred DP, Millipred DP 12-Day, Millipred DP 6 Day, Prednoral What should I tell my health care provider before I take this medicine? They need to know if you have any of these conditions: -Cushing's syndrome -diabetes -glaucoma -heart problems or disease -high blood pressure -infection such as herpes, measles, tuberculosis, or chickenpox -kidney disease -liver disease -mental problems -myasthenia gravis -osteoporosis -seizures -stomach ulcer or intestine disease including colitis and diverticulitis -thyroid problem -an unusual or allergic reaction to lactose, prednisolone, other medicines, foods, dyes, or preservatives -pregnant or trying to get pregnant -breast-feeding How should I use this medicine? Take this medicine by mouth with a glass of water. Follow the directions on the prescription label. Take it with food or milk to avoid stomach upset. If you are taking this medicine once a day, take it in the morning. Do not take more medicine than you are told to take. Do not suddenly stop taking your medicine because you may develop a severe reaction. Your doctor will tell you how much medicine to take. If your doctor wants you to stop the medicine, the dose may be slowly lowered over time to avoid any side effects. Talk to your pediatrician regarding the use of this medicine in children. Special care may be needed. Overdosage: If you think you have taken too much of this medicine contact a poison  control center or emergency room at once. NOTE: This medicine is only for you. Do not share this medicine with others. What if I miss a dose? If you miss a dose, take it as soon as you can. If it is almost time for your next dose, take only that dose. Do not take double or extra doses. What may interact with this medicine? Do not take this medicine with any of the following medications: -metyrapone -mifepristone This medicine may also interact with the following medications: -aminoglutethimide -amphotericin B -aspirin and aspirin-like medicines -barbiturates -certain medicines for diabetes, like glipizide or glyburide -cholestyramine -cholinesterase inhibitors -cyclosporine -digoxin -diuretics -ephedrine -female hormones, like estrogens and birth control pills -isoniazid -ketoconazole -NSAIDS, medicines for pain and inflammation, like ibuprofen or naproxen -phenytoin -rifampin -toxoids -vaccines -warfarin This list may not describe all possible interactions. Give your health care provider a list of all the medicines, herbs, non-prescription drugs, or dietary supplements you use. Also tell them if you smoke, drink alcohol, or use illegal drugs. Some items may interact with your medicine. What should I watch for while using this medicine? Visit your doctor or health care professional for regular checks on your progress. If you are taking this medicine over a prolonged period, carry an identification card with your name and address, the type and dose of your medicine, and your doctor's name and address. This medicine may increase your risk of getting an infection. Tell your doctor or health care professional if you are around anyone with measles or chickenpox, or if you develop sores or blisters that do not heal properly. If you are going to have surgery, tell your doctor or health care professional that you have taken  this medicine within the last twelve months. Ask your doctor or  health care professional about your diet. You may need to lower the amount of salt you eat. This medicine may affect blood sugar levels. If you have diabetes, check with your doctor or health care professional before you change your diet or the dose of your diabetic medicine. What side effects may I notice from receiving this medicine? Side effects that you should report to your doctor or health care professional as soon as possible: -allergic reactions like skin rash, itching or hives, swelling of the face, lips, or tongue -changes in emotions or moods -changes in vision -eye pain -signs and symptoms of high blood sugar such as dizziness; dry mouth; dry skin; fruity breath; nausea; stomach pain; increased hunger or thirst; increased urination -signs and symptoms of infection like fever or chills; cough; sore throat; pain or trouble passing urine -slow growth in children (if used for longer periods of time) -swelling of ankles, feet -trouble sleeping -unusually weak or tired -weak bones (if used for longer periods of time) Side effects that usually do not require medical attention (report to your doctor or health care professional if they continue or are bothersome): -increased hunger -nausea -skin problems, acne, thin and shiny skin -upset stomach -weight gain This list may not describe all possible side effects. Call your doctor for medical advice about side effects. You may report side effects to FDA at 1-800-FDA-1088. Where should I keep my medicine? Keep out of the reach of children. Store at room temperature between 15 and 30 degrees C (59 and 86 degrees F). Keep container tightly closed. Throw away any unused medicine after the expiration date. NOTE: This sheet is a summary. It may not cover all possible information. If you have questions about this medicine, talk to your doctor, pharmacist, or health care provider.  2019 Elsevier/Gold Standard (2015-05-17 12:30:30) Amoxicillin  capsules or tablets What is this medicine? AMOXICILLIN (a mox i SIL in) is a penicillin antibiotic. It is used to treat certain kinds of bacterial infections. It will not work for colds, flu, or other viral infections. This medicine may be used for other purposes; ask your health care provider or pharmacist if you have questions. COMMON BRAND NAME(S): Amoxil, Moxilin, Sumox, Trimox What should I tell my health care provider before I take this medicine? They need to know if you have any of these conditions: -asthma -kidney disease -an unusual or allergic reaction to amoxicillin, other penicillins, cephalosporin antibiotics, other medicines, foods, dyes, or preservatives -pregnant or trying to get pregnant -breast-feeding How should I use this medicine? Take this medicine by mouth with a glass of water. Follow the directions on your prescription label. You may take this medicine with food or on an empty stomach. Take your medicine at regular intervals. Do not take your medicine more often than directed. Take all of your medicine as directed even if you think your are better. Do not skip doses or stop your medicine early. Talk to your pediatrician regarding the use of this medicine in children. While this drug may be prescribed for selected conditions, precautions do apply. Overdosage: If you think you have taken too much of this medicine contact a poison control center or emergency room at once. NOTE: This medicine is only for you. Do not share this medicine with others. What if I miss a dose? If you miss a dose, take it as soon as you can. If it is almost time for your next  dose, take only that dose. Do not take double or extra doses. What may interact with this medicine? -amiloride -birth control pills -chloramphenicol -macrolides -probenecid -sulfonamides -tetracyclines This list may not describe all possible interactions. Give your health care provider a list of all the medicines, herbs,  non-prescription drugs, or dietary supplements you use. Also tell them if you smoke, drink alcohol, or use illegal drugs. Some items may interact with your medicine. What should I watch for while using this medicine? Tell your doctor or health care professional if your symptoms do not improve in 2 or 3 days. Take all of the doses of your medicine as directed. Do not skip doses or stop your medicine early. If you are diabetic, you may get a false positive result for sugar in your urine with certain brands of urine tests. Check with your doctor. Do not treat diarrhea with over-the-counter products. Contact your doctor if you have diarrhea that lasts more than 2 days or if the diarrhea is severe and watery. What side effects may I notice from receiving this medicine? Side effects that you should report to your doctor or health care professional as soon as possible: -allergic reactions like skin rash, itching or hives, swelling of the face, lips, or tongue -breathing problems -dark urine -redness, blistering, peeling or loosening of the skin, including inside the mouth -seizures -severe or watery diarrhea -trouble passing urine or change in the amount of urine -unusual bleeding or bruising -unusually weak or tired -yellowing of the eyes or skin Side effects that usually do not require medical attention (report to your doctor or health care professional if they continue or are bothersome): -dizziness -headache -stomach upset -trouble sleeping This list may not describe all possible side effects. Call your doctor for medical advice about side effects. You may report side effects to FDA at 1-800-FDA-1088. Where should I keep my medicine? Keep out of the reach of children. Store between 68 and 77 degrees F (20 and 25 degrees C). Keep bottle closed tightly. Throw away any unused medicine after the expiration date. NOTE: This sheet is a summary. It may not cover all possible information. If you have  questions about this medicine, talk to your doctor, pharmacist, or health care provider.  2019 Elsevier/Gold Standard (2007-07-06 14:10:59) Pharyngitis  Pharyngitis is a sore throat (pharynx). This is when there is redness, pain, and swelling in your throat. Most of the time, this condition gets better on its own. In some cases, you may need medicine. Follow these instructions at home:  Take over-the-counter and prescription medicines only as told by your doctor. ? If you were prescribed an antibiotic medicine, take it as told by your doctor. Do not stop taking the antibiotic even if you start to feel better. ? Do not give children aspirin. Aspirin has been linked to Reye syndrome.  Drink enough water and fluids to keep your pee (urine) clear or pale yellow.  Get a lot of rest.  Rinse your mouth (gargle) with a salt-water mixture 3-4 times a day or as needed. To make a salt-water mixture, completely dissolve -1 tsp of salt in 1 cup of warm water.  If your doctor approves, you may use throat lozenges or sprays to soothe your throat. Contact a doctor if:  You have large, tender lumps in your neck.  You have a rash.  You cough up green, yellow-brown, or bloody spit. Get help right away if:  You have a stiff neck.  You drool or cannot  swallow liquids.  You cannot drink or take medicines without throwing up.  You have very bad pain that does not go away with medicine.  You have problems breathing, and it is not from a stuffy nose.  You have new pain and swelling in your knees, ankles, wrists, or elbows. Summary  Pharyngitis is a sore throat (pharynx). This is when there is redness, pain, and swelling in your throat.  If you were prescribed an antibiotic medicine, take it as told by your doctor. Do not stop taking the antibiotic even if you start to feel better.  Most of the time, pharyngitis gets better on its own. Sometimes, you may need medicine. This information is not  intended to replace advice given to you by your health care provider. Make sure you discuss any questions you have with your health care provider. Document Released: 10/01/2007 Document Revised: 05/20/2016 Document Reviewed: 05/20/2016 Elsevier Interactive Patient Education  2019 [employer] Cetirizine tablets What is this medicine? CETIRIZINE (se TI ra zeen) is an antihistamine. This medicine is used to treat or prevent symptoms of allergies. It is also used to help reduce itchy skin rash and hives. This medicine may be used for other purposes; ask your health care provider or pharmacist if you have questions. COMMON BRAND NAME(S): All Day Allergy, Allergy Relief, Zyrtec, Zyrtec Hives Relief What should I tell my health care provider before I take this medicine? They need to know if you have any of these conditions: -kidney disease -liver disease -an unusual or allergic reaction to cetirizine, hydroxyzine, other medicines, foods, dyes, or preservatives -pregnant or trying to get pregnant -breast-feeding How should I use this medicine? Take this medicine by mouth with a glass of water. Follow the directions on the prescription label. You can take this medicine with food or on an empty stomach. Take your medicine at regular times. Do not take more often than directed. You may need to take this medicine for several days before your symptoms improve. Talk to your pediatrician regarding the use of this medicine in children. Special care may be needed. While this drug may be prescribed for children as young as 3 years of age for selected conditions, precautions do apply. Overdosage: If you think you have taken too much of this medicine contact a poison control center or emergency room at once. NOTE: This medicine is only for you. Do not share this medicine with others. What if I miss a dose? If you miss a dose, take it as soon as you can. If it is almost time for your next dose, take only that  dose. Do not take double or extra doses. What may interact with this medicine? -alcohol -certain medicines for anxiety or sleep -narcotic medicines for pain -other medicines for colds or allergies This list may not describe all possible interactions. Give your health care provider a list of all the medicines, herbs, non-prescription drugs, or dietary supplements you use. Also tell them if you smoke, drink alcohol, or use illegal drugs. Some items may interact with your medicine. What should I watch for while using this medicine? Visit your doctor or health care professional for regular checks on your health. Tell your doctor if your symptoms do not improve. You may get drowsy or dizzy. Do not drive, use machinery, or do anything that needs mental alertness until you know how this medicine affects you. Do not stand or sit up quickly, especially if you are an older patient. This reduces the risk of  dizzy or fainting spells. Your mouth may get dry. Chewing sugarless gum or sucking hard candy, and drinking plenty of water may help. Contact your doctor if the problem does not go away or is severe. What side effects may I notice from receiving this medicine? Side effects that you should report to your doctor or health care professional as soon as possible: -allergic reactions like skin rash, itching or hives, swelling of the face, lips, or tongue -changes in vision or hearing -fast or irregular heartbeat -trouble passing urine or change in the amount of urine Side effects that usually do not require medical attention (report to your doctor or health care professional if they continue or are bothersome): -dizziness -dry mouth -irritability -sore throat -stomach pain -tiredness This list may not describe all possible side effects. Call your doctor for medical advice about side effects. You may report side effects to FDA at 1-800-FDA-1088. Where should I keep my medicine? Keep out of the reach of  children. Store at room temperature between 15 and 30 degrees C (59 and 86 degrees F). Throw away any unused medicine after the expiration date. NOTE: This sheet is a summary. It may not cover all possible information. If you have questions about this medicine, talk to your doctor, pharmacist, or health care provider.  2019 Elsevier/Gold Standard (2014-05-09 13:44:42) Fluticasone nasal spray What is this medicine? FLUTICASONE (floo TIK a sone) is a corticosteroid. This medicine is used to treat the symptoms of allergies like sneezing, itchy red eyes, and itchy, runny, or stuffy nose. This medicine is also used to treat nasal polyps. This medicine may be used for other purposes; ask your health care provider or pharmacist if you have questions. COMMON BRAND NAME(S): ClariSpray, Flonase, Flonase Allergy Relief, Flonase Sensimist, Veramyst, XHANCE What should I tell my health care provider before I take this medicine? They need to know if you have any of these conditions: -eye disease, vision problems -infection, like tuberculosis, herpes, or fungal infection -recent surgery on nose or sinuses -taking a corticosteroid by mouth -an unusual or allergic reaction to fluticasone, steroids, other medicines, foods, dyes, or preservatives -pregnant or trying to get pregnant -breast-feeding How should I use this medicine? This medicine is for use in the nose. Follow the directions on your product or prescription label. This medicine works best if used at regular intervals. Do not use more often than directed. Make sure that you are using your nasal spray correctly. After 6 months of daily use for allergies, talk to your doctor or health care professional before using it for a longer time. Ask your doctor or health care professional if you have any questions. Talk to your pediatrician regarding the use of this medicine in children. Special care may be needed. Some products have been used for allergies in  children as young as 2 years. After 2 months of daily use without a prescription in a child, talk to your pediatrician before using it for a longer time. Use of this medicine for nasal polyps is not approved in children. Overdosage: If you think you have taken too much of this medicine contact a poison control center or emergency room at once. NOTE: This medicine is only for you. Do not share this medicine with others. What if I miss a dose? If you miss a dose, use it as soon as you remember. If it is almost time for your next dose, use only that dose and continue with your regular schedule. Do not use double  or extra doses. What may interact with this medicine? -certain antibiotics like clarithromycin and telithromycin -certain medicines for fungal infections like ketoconazole, itraconazole, and voriconazole -conivaptan -nefazodone -some medicines for HIV -vaccines This list may not describe all possible interactions. Give your health care provider a list of all the medicines, herbs, non-prescription drugs, or dietary supplements you use. Also tell them if you smoke, drink alcohol, or use illegal drugs. Some items may interact with your medicine. What should I watch for while using this medicine? Visit your healthcare professional for regular checks on your progress. Tell your healthcare professional if your symptoms do not start to get better or if they get worse. This medicine may increase your risk of getting an infection. Tell your doctor or health care professional if you are around anyone with measles or chickenpox, or if you develop sores or blisters that do not heal properly. What side effects may I notice from receiving this medicine? Side effects that you should report to your doctor or health care professional as soon as possible: -allergic reactions like skin rash, itching or hives, swelling of the face, lips, or tongue -changes in vision -crusting or sores in the  nose -nosebleed -signs and symptoms of infection like fever or chills; cough; sore throat -white patches or sores in the mouth or nose Side effects that usually do not require medical attention (report to your doctor or health care professional if they continue or are bothersome): -burning or irritation inside the nose or throat -changes in taste or smell -cough -headache This list may not describe all possible side effects. Call your doctor for medical advice about side effects. You may report side effects to FDA at 1-800-FDA-1088. Where should I keep my medicine? Keep out of the reach of children. Store at room temperature between 15 and 30 degrees C (59 and 86 degrees F). Avoid exposure to extreme heat, cold, or light. Throw away any unused medicine after the expiration date. NOTE: This sheet is a summary. It may not cover all possible information. If you have questions about this medicine, talk to your doctor, pharmacist, or health care provider.  2019 Elsevier/Gold Standard (2017-05-07 14:10:08)

## 2018-07-01 NOTE — Progress Notes (Signed)
Subjective:     Patient ID: Anne Hall, female   DOB: 17-May-1985, 33 y.o.   MRN: [id number]  HPI  Blood pressure 122/78, pulse 69, temperature 98.9 F (37.2 C), temperature source Oral, resp. rate 14, height 5\' 11"  (1.803 m), weight 140 lb (63.5 kg), last menstrual period 06/25/2018, SpO2 100 %.  Patient is a 33 year old female with sore thraot x 3 days.Fever x 1 day " low grade" did not check temperature.  She had strep once last year.   Feb 28th 2020  LMP  Denies any chance of pregnancy.  Son is in daycare.  Patient Active Problem List   Diagnosis Date Noted  . Exercise-induced asthma 02/28/2016  . History of infertility 07/11/2014    Current Outpatient Medications:  .  albuterol (PROVENTIL HFA) 108 (90 Base) MCG/ACT inhaler, Inhale 2 puffs into the lungs every 6 (six) hours as needed for wheezing or shortness of breath., Disp: 1 Inhaler, Rfl: 5 .  Fluticasone-Salmeterol (ADVAIR DISKUS) 100-50 MCG/DOSE AEPB, Inhale 1 puff into the lungs 2 (two) times daily., Disp: 60 each, Rfl: 5  Review of Systems  Constitutional: Positive for fatigue and fever. Negative for activity change, appetite change, chills, diaphoresis and unexpected weight change.  HENT: Positive for congestion, rhinorrhea and sore throat. Negative for dental problem, drooling, ear discharge, ear pain, facial swelling, hearing loss, mouth sores, nosebleeds, postnasal drip, sinus pressure, sinus pain, sneezing, tinnitus, trouble swallowing and voice change.   Eyes: Negative.   Respiratory: Negative.   Cardiovascular: Negative.   Gastrointestinal: Negative.   Endocrine: Negative.   Genitourinary: Negative.   Musculoskeletal: Negative.   Skin: Negative.   Allergic/Immunologic: Positive for environmental allergies and food allergies. Negative for immunocompromised state.        -- Gluten Meal   -- Milk-Related Compounds   -- Sugar-Protein-Starch    Neurological: Negative.   Hematological: Negative.    Psychiatric/Behavioral: Negative.        Objective:   Physical Exam Constitutional:      General: She is not in acute distress.    Appearance: She is well-developed and normal weight. She is not ill-appearing, toxic-appearing or diaphoretic.  HENT:     Head: Normocephalic and atraumatic.     Salivary Glands: Right salivary gland is not diffusely enlarged or tender. Left salivary gland is not diffusely enlarged or tender.     Right Ear: Hearing and external ear normal. No drainage, swelling or tenderness. A middle ear effusion is present. Tympanic membrane is not erythematous or bulging.     Left Ear: Hearing and external ear normal. No drainage, swelling or tenderness. A middle ear effusion is present. Tympanic membrane is not erythematous or bulging.     Nose: Mucosal edema, congestion and rhinorrhea present.     Right Sinus: No maxillary sinus tenderness or frontal sinus tenderness.     Left Sinus: No maxillary sinus tenderness or frontal sinus tenderness.     Mouth/Throat:     Lips: Pink.     Mouth: Mucous membranes are moist. No oral lesions.     Pharynx: Uvula midline. Oropharyngeal exudate and posterior oropharyngeal erythema present. No pharyngeal swelling or uvula swelling.     Tonsils: Tonsillar exudate present. No tonsillar abscesses. Swelling: 1+ on the right. 1+ on the left.  Eyes:     Conjunctiva/sclera: Conjunctivae normal.     Pupils: Pupils are equal, round, and reactive to light.  Neck:     Musculoskeletal: Normal range of motion and neck  supple.  Cardiovascular:     Rate and Rhythm: Normal rate and regular rhythm.     Heart sounds: Normal heart sounds. No murmur. No friction rub. No gallop.   Skin:    General: Skin is warm and dry.     Capillary Refill: Capillary refill takes less than 2 seconds.     Coloration: Skin is not pale.     Findings: No erythema or rash.  Neurological:     Mental Status: She is alert and oriented to person, place, and time.   Psychiatric:        Mood and Affect: Mood normal.        Assessment:  Pharyngitis due to Streptococcus species - Plan: POCT Rapid Strep A-Office (CPT [id number])  Asthma due to seasonal allergies  Results for orders placed or performed in visit on 07/01/18 (from the past 24 hour(s))  POCT Rapid Strep A-Office (CPT 86761)     Status: Abnormal   Collection Time: 07/01/18 12:22 PM  Result Value Ref Range   Rapid Strep A Screen Positive (A) Negative       Plan:     Ahriyah was seen today for sore throat.  Diagnoses and all orders for this visit:  Pharyngitis due to Streptococcus species -     POCT Rapid Strep A-Office (CPT [phone])  Asthma due to seasonal allergies  Other orders -     amoxicillin (AMOXIL) 875 MG tablet; Take 1 tablet (875 mg total) by mouth 2 (two) times daily. -     predniSONE (STERAPRED UNI-PAK 21 TAB) 10 MG (21) TBPK tablet; PO: Take 6 tablets on day 1:Take 5 tablets day 2:Take 4 tablets day 3: Take 3 tablets day 4:Take 2 tablets day five: 5 Take 1 tablet day 6 -     cetirizine (ZYRTEC) 10 MG tablet; Take 1 tablet (10 mg total) by mouth daily. -     fluticasone (FLONASE) 50 MCG/ACT nasal spray; Place 2 sprays into both nostrils daily.    Discussed adding on allergy medication with tree pollen being high. Patient is an avid outdoor runner and will be running marathons.  She has taken Singulair in past but stopped due to side effects she did not like. She also stopped Zyrtec and Flonase.   She has a flexible spending card and would like to use for the above prescriptions Over the counter also called in.   Advised patient call the office or your primary care doctor for an appointment if no improvement within 72 hours or if any symptoms change or worsen at any time  Advised ER or urgent Care if after hours or on weekend. Call 911 for emergency symptoms at any time.Patinet verbalized understanding of all instructions given/reviewed and treatment plan and has no further  questions or concerns at this time.    Patient verbalized understanding of all instructions given and denies any further questions at this time.

## 2018-07-13 ENCOUNTER — Other Ambulatory Visit: Payer: Self-pay

## 2018-07-13 ENCOUNTER — Encounter: Payer: Self-pay | Admitting: Adult Health

## 2018-07-13 ENCOUNTER — Ambulatory Visit: Payer: Managed Care, Other (non HMO) | Admitting: Adult Health

## 2018-07-13 VITALS — BP 112/60 | HR 77 | Temp 99.2°F | Resp 14

## 2018-07-13 DIAGNOSIS — J0301 Acute recurrent streptococcal tonsillitis: Secondary | ICD-10-CM

## 2018-07-13 DIAGNOSIS — J029 Acute pharyngitis, unspecified: Secondary | ICD-10-CM

## 2018-07-13 LAB — POCT RAPID STREP A (OFFICE): Rapid Strep A Screen: NEGATIVE

## 2018-07-13 MED ORDER — CLINDAMYCIN HCL 300 MG PO CAPS: 300.0000 mg | ORAL_CAPSULE | Freq: Three times a day (TID) | ORAL | 0 refills | Status: DC

## 2018-07-13 NOTE — Progress Notes (Signed)
[facility]  Subjective:     Patient ID: Anne Hall, female   DOB: [birth year], 33 y.o.   MRN: [id number]  HPI   Blood pressure 112/60, pulse 77, temperature 99.2 F (37.3 C), temperature source Oral, resp. rate 14, last menstrual period 06/25/2018, SpO2 100 %.  Patient is a 33 year old female in no acute distress who comes to the clinic for complaints of sore throat- she reports she was feeling really well and then her sore throat returned yesterday.   She is able to swallow without difficulty.  Painful swallowing.   She has asthma. She is allergic to tree pollen -mild cough- still running outside.  3/5 positive strep pharyngitis as well as mild asthma due to seasonal allergies.  Treated with :  Other orders -     amoxicillin (AMOXIL) 875 MG tablet; Take 1 tablet (875 mg total) by mouth 2 (two) times daily. -     predniSONE (STERAPRED UNI-PAK 21 TAB) 10 MG (21) TBPK tablet; PO: Take 6 tablets on day 1:Take 5 tablets day 2:Take 4 tablets day 3: Take 3 tablets day 4:Take 2 tablets day five: 5 Take 1 tablet day 6 -     cetirizine (ZYRTEC) 10 MG tablet; Take 1 tablet (10 mg total) by mouth daily. -     fluticasone (FLONASE) 50 MCG/ACT nasal spray; Place 2 sprays into both nostrils daily.   Denies any exposure to Coronavirus regions affected on map located on  on Centers for Disease Control guidelines/web site and denies any exposure to any infected persons with coronavirus given recent [place] states outbreak and [place]  state of emergency is in  effect.  Allergies  Allergen Reactions  . Gluten Meal   . Milk-Related Compounds   . Sugar-Protein-Starch    Patient Active Problem List   Diagnosis Date Noted  . Exercise-induced asthma 02/28/2016  . History of infertility 07/11/2014     Current Outpatient Medications:  .  albuterol (PROVENTIL HFA) 108 (90 Base) MCG/ACT inhaler, Inhale 2 puffs into the lungs every 6 (six) hours as  needed for wheezing or shortness of breath., Disp: 1 Inhaler, Rfl: 5 .  cetirizine (ZYRTEC) 10 MG tablet, Take 1 tablet (10 mg total) by mouth daily., Disp: 30 tablet, Rfl: 3 .  fluticasone (FLONASE) 50 MCG/ACT nasal spray, Place 2 sprays into both nostrils daily., Disp: 16 g, Rfl: 3 .  Fluticasone-Salmeterol (ADVAIR DISKUS) 100-50 MCG/DOSE AEPB, Inhale 1 puff into the lungs 2 (two) times daily. (Patient not taking: Reported on 07/01/2018), Disp: 60 each, Rfl: 5  Review of Systems  Constitutional: Positive for fatigue and fever (99.2 in office ). Negative for activity change, appetite change, chills, diaphoresis and unexpected weight change.  HENT: Positive for sore throat. Negative for congestion, dental problem, drooling, ear discharge, ear pain, facial swelling, hearing loss, mouth sores, nosebleeds, postnasal drip, rhinorrhea, sinus pressure, sinus pain, sneezing, tinnitus, trouble swallowing and voice change.   Eyes: Negative.   Respiratory: Positive for cough (midl with running outside - allergy meds and inhalers help resolve ). Negative for apnea, choking, chest tightness, shortness of breath, wheezing and stridor.   Cardiovascular: Negative.   Gastrointestinal: Negative.   Endocrine: Negative.   Genitourinary: Negative.   Musculoskeletal: Negative.   Neurological: Negative.   Psychiatric/Behavioral: Negative.        Objective:   Physical Exam Vitals signs reviewed.  Constitutional:      General: She is not in acute distress.  Appearance: She is well-developed. She is not ill-appearing or diaphoretic.     Comments: Patient is alert and oriented and responsive to questions Engages in eye contact with provider. Speaks in full sentences without any pauses without any shortness of breath or distress.    HENT:     Head: Normocephalic and atraumatic.     Salivary Glands: Right salivary gland is not diffusely enlarged or tender. Left salivary gland is not diffusely enlarged or tender.      Right Ear: Hearing, tympanic membrane, ear canal and external ear normal. No drainage, swelling or tenderness. No middle ear effusion. Tympanic membrane is not erythematous.     Left Ear: Hearing, tympanic membrane, ear canal and external ear normal. No drainage, swelling or tenderness.  No middle ear effusion. Tympanic membrane is not erythematous.     Nose: Nose normal.     Right Sinus: No maxillary sinus tenderness or frontal sinus tenderness.     Left Sinus: No maxillary sinus tenderness or frontal sinus tenderness.     Mouth/Throat:     Lips: Pink.     Mouth: Mucous membranes are moist. No oral lesions.     Pharynx: Uvula midline. Oropharyngeal exudate and posterior oropharyngeal erythema present. No pharyngeal swelling or uvula swelling.     Tonsils: Tonsillar exudate present. No tonsillar abscesses. Swelling: 1+ on the right. 1+ on the left.     Comments: White exudate bilateral tonsils  Eyes:     Conjunctiva/sclera: Conjunctivae normal.     Pupils: Pupils are equal, round, and reactive to light.  Neck:     Musculoskeletal: Normal range of motion and neck supple.     Thyroid: No thyromegaly.  Cardiovascular:     Rate and Rhythm: Normal rate and regular rhythm.     Heart sounds: Normal heart sounds. No murmur. No friction rub. No gallop.   Pulmonary:     Effort: Pulmonary effort is normal.     Breath sounds: Normal breath sounds.  Abdominal:     Palpations: Abdomen is soft.  Lymphadenopathy:     Cervical: Cervical adenopathy present.  Skin:    General: Skin is warm and dry.     Capillary Refill: Capillary refill takes less than 2 seconds.  Neurological:     General: No focal deficit present.     Mental Status: She is alert and oriented to person, place, and time.     Comments: Patient moves on and off of exam table and in room without difficulty. Gait is normal in hall and in room. Patient is oriented to person place time and situation. Patient answers questions  appropriately and engages in conversation.   Psychiatric:        Mood and Affect: Mood normal.        Behavior: Behavior normal.        Assessment:     Acute recurrent streptococcal tonsillitis - Plan: Strep A DNA probe  Sore throat - Plan: POCT Rapid Strep A-Office (CPT [id number]), Culture, Group A Strep      Plan:     Sakeena was seen today for sore throat.  Diagnoses and all orders for this visit:  Acute recurrent streptococcal tonsillitis -     Strep A DNA probe  Sore throat -     POCT Rapid Strep A-Office (CPT [id number]) -     Culture, Group A Strep  Other orders -     clindamycin (CLEOCIN) 300 MG capsule; Take 1 capsule (300 mg total) by  mouth 3 (three) times daily.  Discussed medications and side effects/ black box warnings.  Discussed RED FLAGS.  Follow up with primary care if symptoms persist as below.   Gave and reviewed After Visit Summary(AVS) with patient. Patient is advised to read the after visit summary as well and let the provider know if any question, concerns or clarifications are needed.   Advised patient call the office or your primary care doctor for an appointment if no improvement within 72 hours or if any symptoms change or worsen at any time  Advised ER or urgent Care if after hours or on weekend. Call 911 for emergency symptoms at any time.Patinet verbalized understanding of all instructions given/reviewed and treatment plan and has no further questions or concerns at this time.    Patient verbalized understanding of all instructions given and denies any further questions at this time.

## 2018-07-13 NOTE — Patient Instructions (Signed)
Clindamycin capsules What is this medicine? CLINDAMYCIN (Hobson sin) is a lincosamide antibiotic. It is used to treat certain kinds of bacterial infections. It will not work for colds, flu, or other viral infections. This medicine may be used for other purposes; ask your health care provider or pharmacist if you have questions. COMMON BRAND NAME(S): Cleocin What should I tell my health care provider before I take this medicine? They need to know if you have any of these conditions: -kidney disease -liver disease -stomach problems like colitis -an unusual or allergic reaction to clindamycin, lincomycin, or other medicines, foods, dyes like tartrazine or preservatives -pregnant or trying to get pregnant -breast-feeding How should I use this medicine? Take this medicine by mouth with a full glass of water. Follow the directions on the prescription label. You can take this medicine with food or on an empty stomach. If the medicine upsets your stomach, take it with food. Take your medicine at regular intervals. Do not take your medicine more often than directed. Take all of your medicine as directed even if you think your are better. Do not skip doses or stop your medicine early. Talk to your pediatrician regarding the use of this medicine in children. Special care may be needed. Overdosage: If you think you have taken too much of this medicine contact a poison control center or emergency room at once. NOTE: This medicine is only for you. Do not share this medicine with others. What if I miss a dose? If you miss a dose, take it as soon as you can. If it is almost time for your next dose, take only that dose. Do not take double or extra doses. What may interact with this medicine? -birth control pills -erythromycin -medicines that relax muscles for surgery -rifampin This list may not describe all possible interactions. Give your health care provider a list of all the medicines, herbs,  non-prescription drugs, or dietary supplements you use. Also tell them if you smoke, drink alcohol, or use illegal drugs. Some items may interact with your medicine. What should I watch for while using this medicine? Tell your doctor or healthcare professional if your symptoms do not start to get better or if they get worse. Do not treat diarrhea with over the counter products. Contact your doctor if you have diarrhea that lasts more than 2 days or if it is severe and watery. What side effects may I notice from receiving this medicine? Side effects that you should report to your doctor or health care professional as soon as possible: -allergic reactions like skin rash, itching or hives, swelling of the face, lips, or tongue -dark urine -pain on swallowing -redness, blistering, peeling or loosening of the skin, including inside the mouth -unusual bleeding or bruising -unusually weak or tired -yellowing of eyes or skin Side effects that usually do not require medical attention (report to your doctor or health care professional if they continue or are bothersome): -diarrhea -itching in the rectal or genital area -joint pain -nausea, vomiting -stomach pain This list may not describe all possible side effects. Call your doctor for medical advice about side effects. You may report side effects to FDA at 1-800-FDA-1088. Where should I keep my medicine? Keep out of the reach of children. Store at room temperature between 20 and 25 degrees C (68 and 77 degrees F). Throw away any unused medicine after the expiration date. NOTE: This sheet is a summary. It may not cover all possible information. If you  have questions about this medicine, talk to your doctor, pharmacist, or health care provider.  2019 Elsevier/Gold Standard (2015-07-18 16:34:00) Tonsillitis  Tonsillitis is an infection of the throat that causes the tonsils to become red, tender, and swollen. Tonsils are tissues in the back of your  throat. Each tonsil has crevices (crypts). Tonsils normally work to protect the body from infection. What are the causes? Sudden (acute) tonsillitis may be caused by a virus or bacteria, including streptococcal bacteria. Long-lasting (chronic) tonsillitis occurs when the crypts of the tonsils become filled with pieces of food and bacteria, which makes it easy for the tonsils to become repeatedly infected. Tonsillitis can be spread from person to person (is contagious). It may be spread by inhaling droplets that are released with coughing or sneezing. You may also come into contact with viruses or bacteria on surfaces, such as cups or utensils. What are the signs or symptoms? Symptoms of this condition include:  A sore throat. This may include trouble swallowing.  White patches on the tonsils.  Swollen tonsils.  Fever.  Headache.  Tiredness.  Loss of appetite.  Snoring during sleep when you did not snore before.  Small, foul-smelling, yellowish-white pieces of material (tonsilloliths) that you occasionally cough up or spit out. These can cause you to have bad breath. How is this diagnosed? This condition is diagnosed with a physical exam. Diagnosis can be confirmed with the results of lab tests, including a throat culture. How is this treated? Treatment for this condition depends on the cause, but usually focuses on treating the symptoms associated with it. Treatment may include:  Medicines to relieve pain and manage fever.  Steroid medicines to reduce swelling.  Antibiotic medicines if the condition is caused by bacteria. If attacks of tonsillitis are severe and frequent, your health care provider may recommend surgery to remove the tonsils (tonsillectomy). Follow these instructions at home: Medicines  Take over-the-counter and prescription medicines only as told by your health care provider.  If you were prescribed an antibiotic medicine, take it as told by your health care  provider. Do not stop taking the antibiotic even if you start to feel better. Eating and drinking  Drink enough fluid to keep your urine clear or pale yellow.  While your throat is sore, eat soft or liquid foods, such as sherbet, soups, or instant breakfast drinks.  Drink warm liquids.  Eat frozen ice pops. General instructions  Rest as much as possible and get plenty of sleep.  Gargle with a salt-water mixture 3-4 times a day or as needed. To make a salt-water mixture, completely dissolve -1 tsp of salt in 1 cup of warm water.  Wash your hands regularly with soap and water. If soap and water are not available, use hand sanitizer.  Do not share cups, bottles, or other utensils until your symptoms have gone away.  Do not smoke. This can help your symptoms and prevent the infection from coming back. If you need help quitting, ask your health care provider.  Keep all follow-up visits as told by your health care provider. This is important. Contact a health care provider if:  You notice large, tender lumps in your neck that were not there before.  You have a fever that does not go away after 2-3 days.  You develop a rash.  You cough up a green, yellow-brown, or bloody substance.  You cannot swallow liquids or food for 24 hours.  Only one of your tonsils is swollen. Get help right  away if:  You develop any new symptoms, such as vomiting, severe headache, stiff neck, chest pain, trouble breathing, or trouble swallowing.  You have severe throat pain along with drooling or voice changes.  You have severe pain that is not controlled with medicines.  You cannot fully open your mouth.  You develop redness, swelling, or severe pain anywhere in your neck. Summary  Tonsillitis is an infection of the throat that causes the tonsils to become red, tender, and swollen.  Tonsillitis may be caused by a virus or bacteria.  Rest as much as possible. Get plenty of sleep. This  information is not intended to replace advice given to you by your health care provider. Make sure you discuss any questions you have with your health care provider. Document Released: 01/22/2005 Document Revised: 05/20/2016 Document Reviewed: 05/20/2016 Elsevier Interactive Patient Education  2019 [employer].

## 2018-07-14 LAB — STREP A DNA PROBE: Strep Gp A Direct, DNA Probe: NEGATIVE

## 2018-07-15 NOTE — Progress Notes (Signed)
Patient called she was informed of negative strep. She is feeling better. She will follow up with her Tracey Harries, FNP if any symptoms persist.

## 2018-08-21 ENCOUNTER — Other Ambulatory Visit: Payer: Self-pay | Admitting: Adult Health

## 2019-02-02 ENCOUNTER — Ambulatory Visit: Payer: Managed Care, Other (non HMO) | Admitting: Adult Health

## 2019-02-02 ENCOUNTER — Encounter: Payer: Self-pay | Admitting: Adult Health

## 2019-02-02 ENCOUNTER — Other Ambulatory Visit: Payer: Self-pay

## 2019-02-02 VITALS — BP 100/60 | HR 70 | Temp 98.0°F | Resp 18 | Ht 71.0 in | Wt 144.0 lb

## 2019-02-02 DIAGNOSIS — Z008 Encounter for other general examination: Secondary | ICD-10-CM | POA: Diagnosis not present

## 2019-02-02 NOTE — Patient Instructions (Signed)
Please follow-up with your primary care provider for fasting labs in the future being that your glucose and cholesterol not fasting this morning.  The main routine health maintenance for females with your primary care provider at least yearly and as advised by your primary care provider on file Anne Green, FNP     I will have the office call you on your glucose and cholesterol results when they return if you have not heard within 1 week please call the office.  This biometric physical is a brief physical and the only labs done are glucose and your lipid panel(cholesterol) and is  not a substitute for seeing a primary care provider for a complete annual physical. Please see a primary care physician for routine health maintenance, labs and full physical at least yearly and follow up as recommended by your provider. Provider also recommends if you do not have a primary care provider for patient to establish care as soon as possible .Patient may chose provider of choice. Also gave the [place] at [phone] or web site at [email or web address] to help assist with finding a primary care doctor.  Patient verbalizes understanding that his office is acute care only and not a substitute for a primary care or for the management of chronic conditions.    Health Maintenance, Female Adopting a healthy lifestyle and getting preventive care are important in promoting health and wellness. Ask your health care provider about:  The right schedule for you to have regular tests and exams.  Things you can do on your own to prevent diseases and keep yourself healthy. What should I know about diet, weight, and exercise? Eat a healthy diet   Eat a diet that includes plenty of vegetables, fruits, low-fat dairy products, and lean protein.  Do not eat a lot of foods that are high in solid fats, added sugars, or sodium. Maintain a healthy weight Body mass index (BMI) is used to  identify weight problems. It estimates body fat based on height and weight. Your health care provider can help determine your BMI and help you achieve or maintain a healthy weight. Get regular exercise Get regular exercise. This is one of the most important things you can do for your health. Most adults should:  Exercise for at least 150 minutes each week. The exercise should increase your heart rate and make you sweat (moderate-intensity exercise).  Do strengthening exercises at least twice a week. This is in addition to the moderate-intensity exercise.  Spend less time sitting. Even light physical activity can be beneficial. Watch cholesterol and blood lipids Have your blood tested for lipids and cholesterol at 33 years of age, then have this test every 5 years. Have your cholesterol levels checked more often if:  Your lipid or cholesterol levels are high.  You are older than 33 years of age.  You are at high risk for heart disease. What should I know about cancer screening? Depending on your health history and family history, you may need to have cancer screening at various ages. This may include screening for:  Breast cancer.  Cervical cancer.  Colorectal cancer.  Skin cancer.  Lung cancer. What should I know about heart disease, diabetes, and high blood pressure? Blood pressure and heart disease  High blood pressure causes heart disease and increases the risk of stroke. This is more likely to develop in people who have high blood pressure readings, are of African descent, or are overweight.  Have your blood pressure checked: ? Every 3-5 years if you are 21-70 years of age. ? Every year if you are 65 years old or older. Diabetes Have regular diabetes screenings. This checks your fasting blood sugar level. Have the screening done:  Once every three years after age 62 if you are at a normal weight and have a low risk for diabetes.  More often and at a younger age if you  are overweight or have a high risk for diabetes. What should I know about preventing infection? Hepatitis B If you have a higher risk for hepatitis B, you should be screened for this virus. Talk with your health care provider to find out if you are at risk for hepatitis B infection. Hepatitis C Testing is recommended for:  Everyone born from [birth year] through 1965.  Anyone with known risk factors for hepatitis C. Sexually transmitted infections (STIs)  Get screened for STIs, including gonorrhea and chlamydia, if: ? You are sexually active and are younger than 33 years of age. ? You are older than 33 years of age and your health care provider tells you that you are at risk for this type of infection. ? Your sexual activity has changed since you were last screened, and you are at increased risk for chlamydia or gonorrhea. Ask your health care provider if you are at risk.  Ask your health care provider about whether you are at high risk for HIV. Your health care provider may recommend a prescription medicine to help prevent HIV infection. If you choose to take medicine to prevent HIV, you should first get tested for HIV. You should then be tested every 3 months for as long as you are taking the medicine. Pregnancy  If you are about to stop having your period (premenopausal) and you may become pregnant, seek counseling before you get pregnant.  Take 400 to 800 micrograms (mcg) of folic acid every day if you become pregnant.  Ask for birth control (contraception) if you want to prevent pregnancy. Osteoporosis and menopause Osteoporosis is a disease in which the bones lose minerals and strength with aging. This can result in bone fractures. If you are 31 years old or older, or if you are at risk for osteoporosis and fractures, ask your health care provider if you should:  Be screened for bone loss.  Take a calcium or vitamin D supplement to lower your risk of fractures.  Be given hormone  replacement therapy (HRT) to treat symptoms of menopause. Follow these instructions at home: Lifestyle  Do not use any products that contain nicotine or tobacco, such as cigarettes, e-cigarettes, and chewing tobacco. If you need help quitting, ask your health care provider.  Do not use street drugs.  Do not share needles.  Ask your health care provider for help if you need support or information about quitting drugs. Alcohol use  Do not drink alcohol if: ? Your health care provider tells you not to drink. ? You are pregnant, may be pregnant, or are planning to become pregnant.  If you drink alcohol: ? Limit how much you use to 0-1 drink a day. ? Limit intake if you are breastfeeding.  Be aware of how much alcohol is in your drink. In the [place] one drink equals one 12 oz bottle of beer (355 mL), one 5 oz glass of wine (148 mL), or one 1 oz glass of hard liquor (44 mL). General instructions  Schedule regular health, dental, and eye exams.  Stay  current with your vaccines.  Tell your health care provider if: ? You often feel depressed. ? You have ever been abused or do not feel safe at home. Summary  Adopting a healthy lifestyle and getting preventive care are important in promoting health and wellness.  Follow your health care provider's instructions about healthy diet, exercising, and getting tested or screened for diseases.  Follow your health care provider's instructions on monitoring your cholesterol and blood pressure. This information is not intended to replace advice given to you by your health care provider. Make sure you discuss any questions you have with your health care provider. Document Released: 10/28/2010 Document Revised: 04/07/2018 Document Reviewed: 04/07/2018 Elsevier Patient Education  2020 [employer].

## 2019-02-02 NOTE — Progress Notes (Signed)
[facility]  Anne Hall DOB: 34 y.o. MRN: [id number] Allergies  Allergen Reactions  . Gluten Meal   . Milk-Related Compounds   . Sugar-Protein-Starch     Subjective:  Here for Biometric Screen/brief exam Patient is a 33 year old female in no acute distress who comes to clinic for her biometric screening and brief exam.  She is employed with [employer] and enjoys her time with a company. She reports she has a primary care provider Anne Hall, Anne Cree, Anne Hall who she does see regularly.  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 She also needs a form filled out to continue [employer] of children. She denies any changes in her health since her last foster form was completed by Dr. Osvaldo Angst. He denies any thoughts of homicidal or suicidal ideations or plans. She reports she feels well.  She has controlled exercise-induced asthma with an inhaler albuterol that is only while she is running long distances.  She has had this for years.  She has never had any disabling asthma attacks.  He does continue her Zyrtec and her as Astelin nasal spray. He denies any unusual fatigue.  She denies any other chronic health problems or any concerns at this visit.  He tries to maintain a healthy diet and exercise is very important to her. Lab will include glucose and cholesterol, however patient is not fasting today.  She is advised that she should see her primary care for fasting labs in the future.  Patient Active Problem List   Diagnosis Date Noted  . Exercise-induced asthma 02/28/2016  . History of infertility 07/11/2014    Current Outpatient Medications:  .  albuterol (PROVENTIL HFA) 108 (90 Base) MCG/ACT inhaler, Inhale 2 puffs into the lungs every 6 (six) hours as needed for wheezing or shortness of breath., Disp: 1  Inhaler, Rfl: 5 .  cetirizine (ZYRTEC) 10 MG tablet, Take 1 tablet (10 mg total) by mouth daily., Disp: 30 tablet, Rfl: 3 .  fluticasone (FLONASE) 50 MCG/ACT nasal spray, Place 2 sprays into both nostrils daily., Disp: 16 g, Rfl: 3 .  Fluticasone-Salmeterol (ADVAIR DISKUS) 100-50 MCG/DOSE AEPB, Inhale 1 puff into the lungs 2 (two) times daily. (Patient not taking: Reported on 07/01/2018), Disp: 60 each, Rfl: 5 .  prednisoLONE acetate (PRED FORTE) 1 % ophthalmic suspension, , Disp: , Rfl:   Reviewed problem list and medications on file with patient.  Allergies are also reviewed.  Objective: Blood pressure 100/60, pulse 70, temperature 98 F (36.7 C), temperature source Temporal, resp. rate 18, height 5\' 11"  (1.803 m), weight 144 lb (65.3 kg), SpO2 100 %. NAD, well-developed well-nourished HEENT: Within normal limits Neck: Normal, supple, thyroid normal, no anterior or posterior cervical lymphadenopathy Heart: Regular rate and rhythm without murmurs rubs or gallops Lungs: Clear to auscultation without any adventitious lung sounds Patient moves on and off of exam table and in room without difficulty. Gait is normal in hall and in room. Patient is oriented to person place time and situation. Patient answers questions appropriately and engages in conversation.   Assessment: Biometric screen Encounter for other general examination- not a full annual physical - biometric screening with brief exam only  - Plan: Lipid panel, Glucose, random  Encounter for biometric screening    Plan: Completed fostering care physical, see no reason as to why  patient cannot foster.  She will report any changes in her health to her primary care provider who can make adjustments as needed.  Copy of form was scanned in by [place] [facility], CMA below this note.  She is advised to have regular maintenance labs with her primary care provider for female health maintenance.  She is aware that only glucose and lipids are done at  today's visit.   I will have the office call you on your glucose and cholesterol results when they return if you have not heard within 1 week please call the office.  This biometric physical is a brief physical and the only labs done are glucose and your lipid panel(cholesterol) and is  not a substitute for seeing a primary care provider for a complete annual physical. Please see a primary care physician for routine health maintenance, labs and full physical at least yearly and follow up as recommended by your provider. Provider also recommends if you do not have a primary care provider for patient to establish care as soon as possible .Patient may chose provider of choice. Also gave the [place]  PHYSICIAN/PROVIDER  REFERRAL LINE at [phone] or web site at [email or web address] to help assist with finding a primary care doctor.  Patient verbalizes understanding that his office is acute care only and not a substitute for a primary care or for the management of chronic conditions.    Fasting glucose and lipids. Discussed with patient that today's visit here is a limited biometric screening visit (not a comprehensive exam or management of any chronic problems) Discussed some health issues, including healthy eating habits and exercise. Encouraged to follow-up with PCP for annual comprehensive preventive and wellness care (and if applicable, any chronic issues). Questions invited and answered.

## 2019-02-03 LAB — GLUCOSE, RANDOM: Glucose: 65 mg/dL (ref 65–99)

## 2019-02-03 LAB — LIPID PANEL
Chol/HDL Ratio: 2.2 ratio (ref 0.0–4.4)
Cholesterol, Total: 159 mg/dL (ref 100–199)
HDL: 72 mg/dL (ref >39)
LDL Chol Calc (NIH): 77 mg/dL (ref 0–99)
Triglycerides: 44 mg/dL (ref 0–149)
VLDL Cholesterol Cal: 10 mg/dL (ref 5–40)

## 2019-02-03 NOTE — Progress Notes (Signed)
Will you let her know labs are normal even though she was not fasting. This is good. She does not have Mychart account established- she can sign up and we can release.  I will have the office call you on your glucose and cholesterol results when they return if you have not heard within 1 week please call the office.  This biometric physical is a brief physical and the only labs done are glucose and your lipid panel(cholesterol) and is  not a substitute for seeing a primary care provider for a complete annual physical. Please see a primary care physician for routine health maintenance, labs and full physical at least yearly and follow up as recommended by your provider.

## 2019-11-16 IMAGING — US US SOFT TISSUE HEAD/NECK
1 series · 8 of 8 positions shown · non-contrast
Comparison: None.

CLINICAL DATA: Palpable right lateral neck nontender nodule x
months

EXAM:
ULTRASOUND OF HEAD/NECK SOFT TISSUES
TECHNIQUE: Ultrasound examination of the head and neck soft tissues was
performed in the area of clinical concern.

[Series 1: us soft tissue head/neck · 0.05mm/px · 8 acquisitions, 8 frames shown]
[im 1/8]
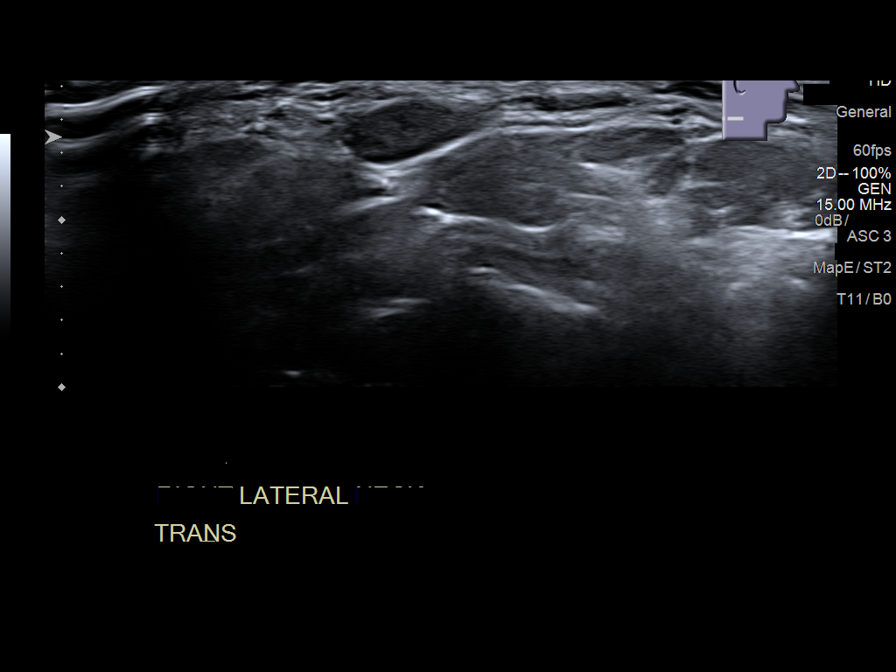
[im 2/8]
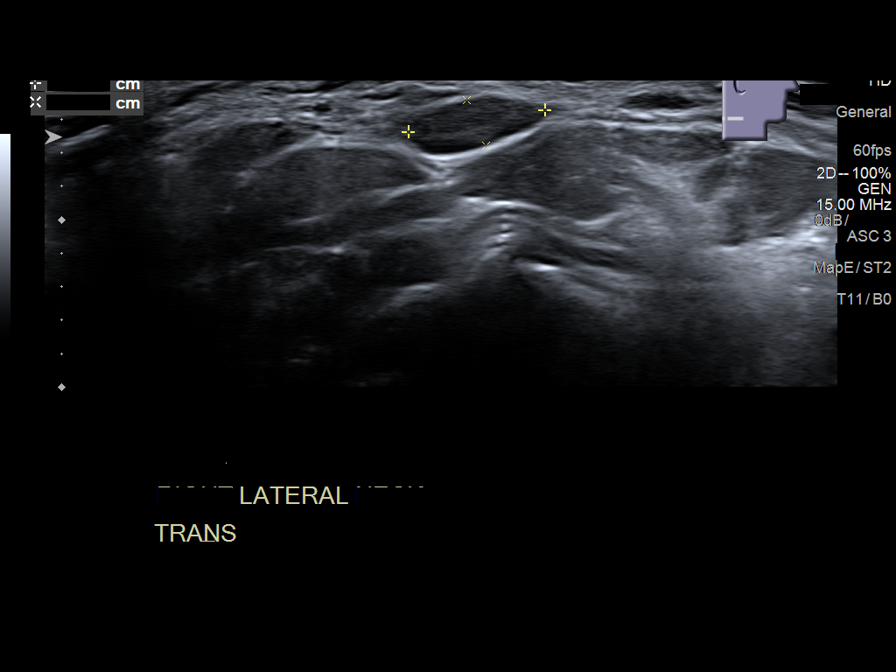
[im 3/8]
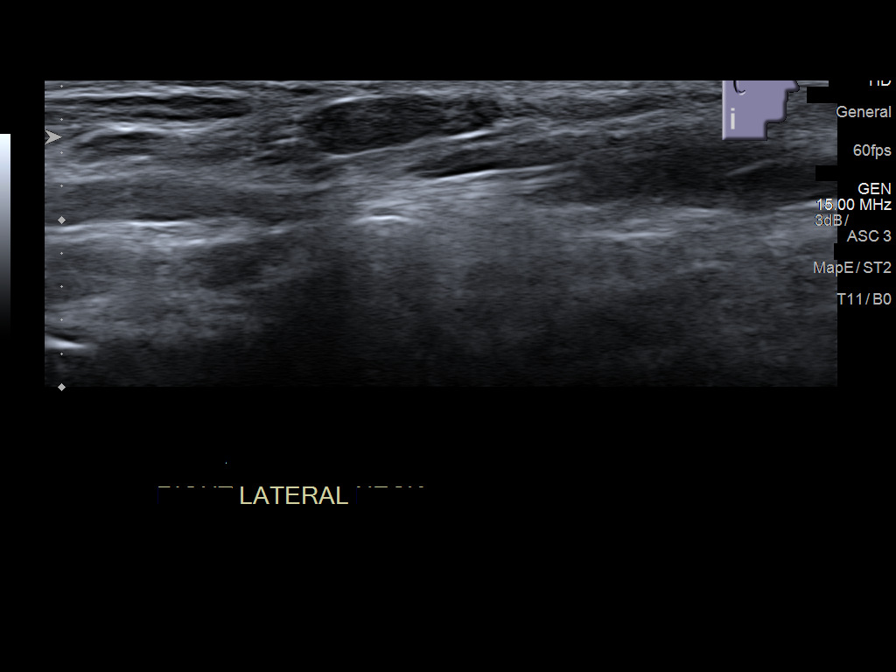
[im 4/8]
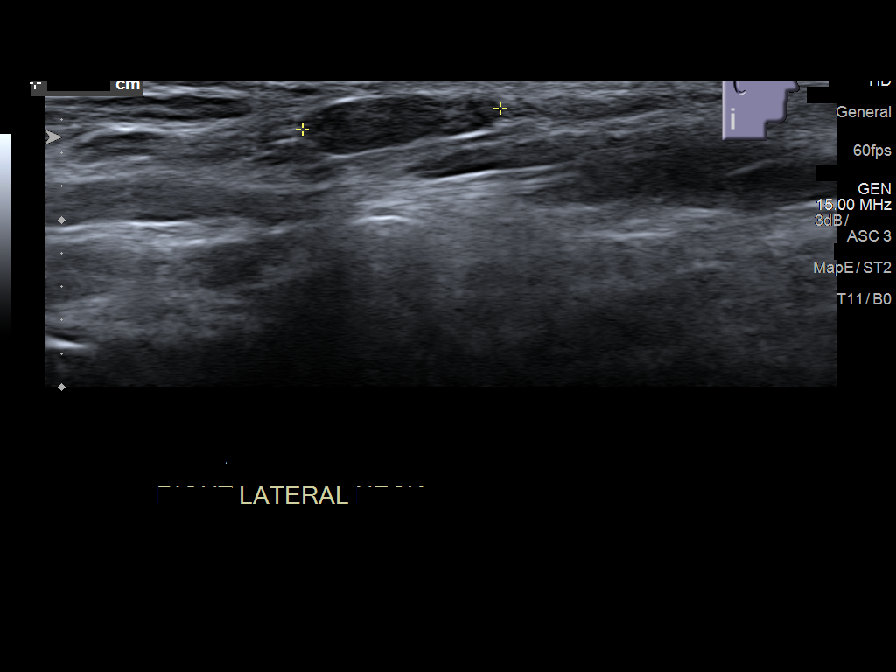
[im 5/8]
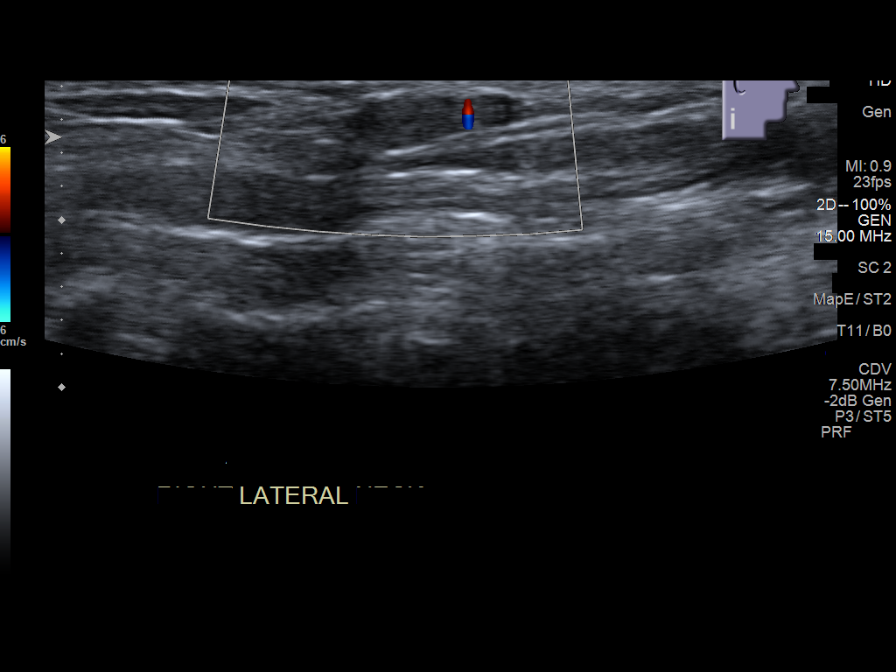
[im 6/8]
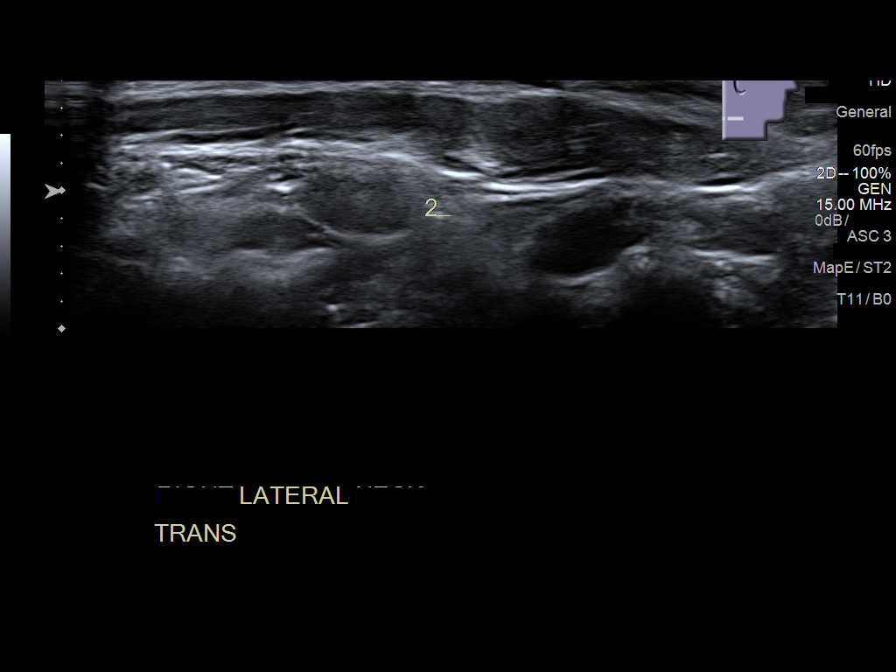
[im 7/8]
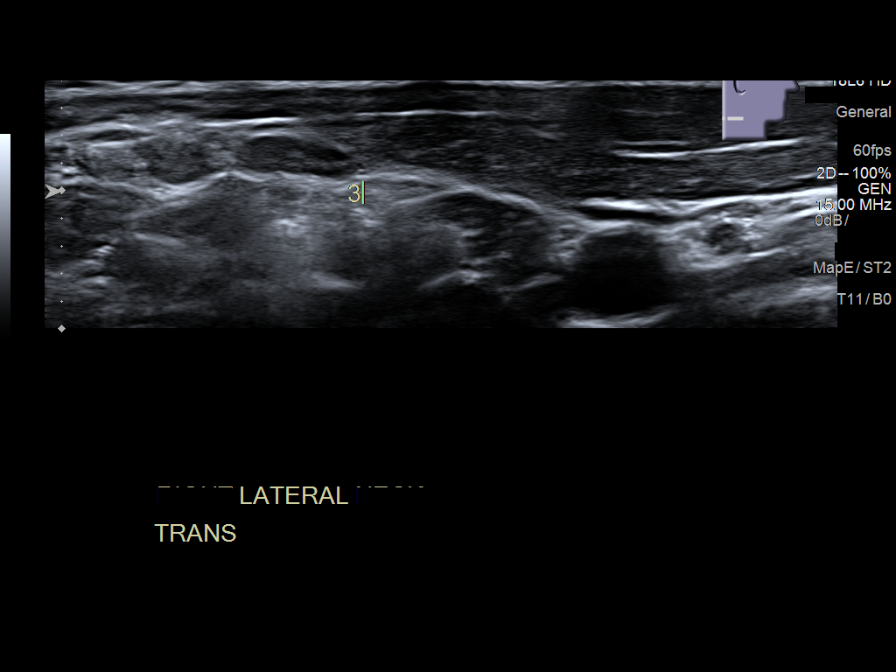
[im 8/8]
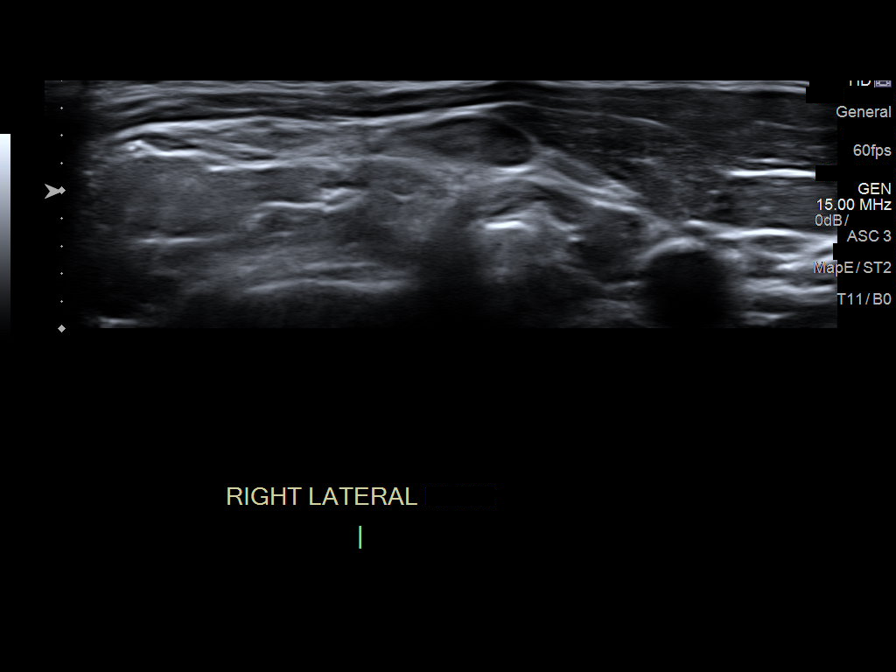

[8 of 8 positions shown; findings below may reference images not displayed]

FINDINGS: Morphologically unremarkable right cervical lymph nodes in the
region of concern, all less than 0.4 cm short axis diameter. No
mass, cyst, adenopathy, aneurysm, or abscess.
IMPRESSION: 1. Unremarkable Regional cervical lymph nodes. No pathologic
findings on ultrasound.

## 2021-08-20 ENCOUNTER — Encounter: Payer: Managed Care, Other (non HMO) | Admitting: Obstetrics and Gynecology

## 2021-08-20 DIAGNOSIS — Z7689 Persons encountering health services in other specified circumstances: Secondary | ICD-10-CM

## 2021-08-20 DIAGNOSIS — Z01419 Encounter for gynecological examination (general) (routine) without abnormal findings: Secondary | ICD-10-CM

## 2021-08-20 DIAGNOSIS — Z124 Encounter for screening for malignant neoplasm of cervix: Secondary | ICD-10-CM

## 2021-10-23 ENCOUNTER — Ambulatory Visit (INDEPENDENT_AMBULATORY_CARE_PROVIDER_SITE_OTHER): Payer: Managed Care, Other (non HMO) | Admitting: Obstetrics and Gynecology

## 2021-10-23 ENCOUNTER — Other Ambulatory Visit (HOSPITAL_COMMUNITY)
Admission: RE | Admit: 2021-10-23 | Discharge: 2021-10-23 | Disposition: A | Discharge status: Home / Self Care | Payer: Managed Care, Other (non HMO) | Source: Ambulatory Visit | Attending: Obstetrics and Gynecology | Admitting: Obstetrics and Gynecology

## 2021-10-23 ENCOUNTER — Encounter: Payer: Self-pay | Admitting: Obstetrics and Gynecology

## 2021-10-23 VITALS — BP 118/80 | HR 74 | Ht 71.0 in | Wt 157.7 lb

## 2021-10-23 DIAGNOSIS — Z7689 Persons encountering health services in other specified circumstances: Secondary | ICD-10-CM | POA: Insufficient documentation

## 2021-10-23 DIAGNOSIS — Z01419 Encounter for gynecological examination (general) (routine) without abnormal findings: Secondary | ICD-10-CM | POA: Insufficient documentation

## 2021-10-23 NOTE — Progress Notes (Signed)
HPI:      [name] is a 36 y.o. G0P0000 who LMP was Patient's last menstrual period was 10/21/2021 (exact date).  Subjective:   She presents today for her annual examination.  She states that she has a period every month but her cycle length has become significantly shorter.  It is usually about 22 days in length.  She has a history of infertility.  She has had unprotected intercourse for more than 7 years without resultant pregnancy.  She has never been pregnant.  She states that when she was younger she did ovulation predictor kits and it showed that she was ovulatory.  Her partner has fathered a child many years ago.  When looking into infertility many years ago he was hesitant to get a semen analysis and so they have adopted since that time. She is not preventing pregnancy and would like to become pregnant but is not actively trying to become pregnant. She has a remote history of slightly abnormal Pap smear that she says required annual follow-up.    Hx: The following portions of the patient's history were reviewed and updated as appropriate:             She  has a past medical history of Abnormal Pap smear of cervix (2015), Asthma, Endometriosis, and Heart murmur. She does not have any pertinent problems on file. She  has a past surgical history that includes Wisdom tooth extraction. Her family history includes Alcohol abuse in her paternal grandfather; Arthritis in her paternal grandmother; Asthma in her father; Breast cancer in her maternal grandmother; Early death in her paternal grandfather; Heart disease in her maternal grandmother; Hyperlipidemia in her mother; Hypertension in her mother; Kidney disease in her paternal grandmother; Miscarriages / [place] in her mother and sister. She  reports that she has never smoked. She has never used smokeless tobacco. She reports that she does not currently use alcohol. She reports that she does not use drugs. She has a current medication  list which includes the following prescription(s): albuterol, cholecalciferol, and magnesium. She is allergic to gluten meal, milk-related compounds, and sugar-protein-starch.       Review of Systems:  Review of Systems  Constitutional: Denied constitutional symptoms, night sweats, recent illness, fatigue, fever, insomnia and weight loss.  Eyes: Denied eye symptoms, eye pain, photophobia, vision change and visual disturbance.  Ears/Nose/Throat/Neck: Denied ear, nose, throat or neck symptoms, hearing loss, nasal discharge, sinus congestion and sore throat.  Cardiovascular: Denied cardiovascular symptoms, arrhythmia, chest pain/pressure, edema, exercise intolerance, orthopnea and palpitations.  Respiratory: Denied pulmonary symptoms, asthma, pleuritic pain, productive sputum, cough, dyspnea and wheezing.  Gastrointestinal: Denied, gastro-esophageal reflux, melena, nausea and vomiting.  Genitourinary: Denied genitourinary symptoms including symptomatic vaginal discharge, pelvic relaxation issues, and urinary complaints.  Musculoskeletal: Denied musculoskeletal symptoms, stiffness, swelling, muscle weakness and myalgia.  Dermatologic: Denied dermatology symptoms, rash and scar.  Neurologic: Denied neurology symptoms, dizziness, headache, neck pain and syncope.  Psychiatric: Denied psychiatric symptoms, anxiety and depression.  Endocrine: Denied endocrine symptoms including hot flashes and night sweats.   Meds:   Current Outpatient Medications on File Prior to Visit  Medication Sig Dispense Refill   albuterol (PROVENTIL HFA) 108 (90 Base) MCG/ACT inhaler Inhale 2 puffs into the lungs every 6 (six) hours as needed for wheezing or shortness of breath. 1 Inhaler 5   cholecalciferol (VITAMIN D3) 25 MCG (1000 UNIT) tablet Take 1,000 Units by mouth daily.     magnesium 30 MG tablet Take 30 mg  by mouth 2 (two) times daily.     No current facility-administered medications on file prior to visit.      Objective:     Vitals:   10/23/21 0908  BP: 118/80  Pulse: 74    Filed Weights   10/23/21 0908  Weight: 157 lb 11.2 oz (71.5 kg)              Physical examination General NAD, Conversant  HEENT Atraumatic; Op clear with mmm.  Normo-cephalic. Pupils reactive. Anicteric sclerae  Thyroid/Neck Smooth without nodularity or enlargement. Normal ROM.  Neck Supple.  Skin No rashes, lesions or ulceration. Normal palpated skin turgor. No nodularity.  Breasts: No masses or discharge.  Symmetric.  No axillary adenopathy.  Lungs: Clear to auscultation.No rales or wheezes. Normal Respiratory effort, no retractions.  Heart: NSR.  No murmurs or rubs appreciated. No periferal edema  Abdomen: Soft.  Non-tender.  No masses.  No HSM. No hernia  Extremities: Moves all appropriately.  Normal ROM for age. No lymphadenopathy.  Neuro: Oriented to PPT.  Normal mood. Normal affect.     Pelvic:   Vulva: Normal appearance.  No lesions.  Vagina: No lesions or abnormalities noted.  Support: Normal pelvic support.  Urethra No masses tenderness or scarring.  Meatus Normal size without lesions or prolapse.  Cervix: Normal appearance.  No lesions.  Anus: Normal exam.  No lesions.  Perineum: Normal exam.  No lesions.        Bimanual   Uterus: Normal size.  Non-tender.  Mobile.  AV.  Adnexae: No masses.  Non-tender to palpation.  Cul-de-sac: Negative for abnormality.     Assessment:    G0P0000 Patient Active Problem List   Diagnosis Date Noted   Exercise-induced asthma 02/28/2016   History of infertility 07/11/2014     1. Establishing care with new doctor, encounter for   2. Well woman exam with routine gynecological exam     Possible infertility.  Unknown if female or female factor.  Based on her cycle length it is possible she has short luteal phase-luteal phase defect as she does seem ovulatory.   Plan:            1.  Basic Screening Recommendations The basic screening recommendations  for asymptomatic women were discussed with the patient during her visit.  The age-appropriate recommendations were discussed with her and the rational for the tests reviewed.  When I am informed by the patient that another primary care physician has previously obtained the age-appropriate tests and they are up-to-date, only outstanding tests are ordered and referrals given as necessary.  Abnormal results of tests will be discussed with her when all of her results are completed.  Routine preventative health maintenance measures emphasized: Exercise/Diet/Weight control, Tobacco Warnings, Alcohol/Substance use risks and Stress Management Pap performed  2.  Offered cycle control but patient declined  3.  Offered infertility work-up but she has declined this at this time.  If she changes her mind regarding cycle control or infertility work-up she may call and we will discuss this further.  Orders No orders of the defined types were placed in this encounter.   No orders of the defined types were placed in this encounter.        F/U  Return in about 1 year (around 10/24/2022) for Annual Physical.  Elonda Husky, M.D. 10/23/2021 9:56 AM

## 2021-10-23 NOTE — Progress Notes (Signed)
Patients presents for annual exam today. She states her cycle lengths have recently become shorter, 19-22 days rather than 30 when she was in her twenties. Patient is due for pap smear, ordered. Patients annual labs declined at this time. Patient states no other questions or concerns at this time.

## 2021-10-24 LAB — CYTOLOGY - PAP
Comment: NEGATIVE
Diagnosis: NEGATIVE
High risk HPV: NEGATIVE

## 2021-12-29 ENCOUNTER — Ambulatory Visit
Admission: EM | Admit: 2021-12-29 | Discharge: 2021-12-29 | Disposition: A | Discharge status: Other Acute Inpatient Hospital | Payer: Managed Care, Other (non HMO)

## 2021-12-30 ENCOUNTER — Ambulatory Visit (INDEPENDENT_AMBULATORY_CARE_PROVIDER_SITE_OTHER): Payer: Managed Care, Other (non HMO)

## 2021-12-30 ENCOUNTER — Ambulatory Visit
Admission: RE | Admit: 2021-12-30 | Discharge: 2021-12-30 | Disposition: A | Discharge status: Home / Self Care | Payer: Managed Care, Other (non HMO) | Source: Ambulatory Visit | Attending: Emergency Medicine | Admitting: Emergency Medicine

## 2021-12-30 VITALS — BP 124/82 | HR 77 | Temp 97.9°F | Resp 18 | Ht 71.0 in | Wt 155.0 lb

## 2021-12-30 DIAGNOSIS — M79672 Pain in left foot: Secondary | ICD-10-CM

## 2021-12-30 DIAGNOSIS — M25572 Pain in left ankle and joints of left foot: Secondary | ICD-10-CM

## 2021-12-30 NOTE — ED Triage Notes (Signed)
Patient to Urgent Care with complaints of left foot pain.   Reports that she was out running and felt a pain in the upper part of her foot. This weekend she reports stepping off a curb incorrectly and the pain in her left foot worsened, describes the pain as so intense it was nauseating.   Patient is able to walk on the heel of her left foot but has difficulty bearing weight completely, describes the pain as aching.   Denies any previous injury.

## 2021-12-30 NOTE — ED Provider Notes (Signed)
Renaldo Fiddler    CSN: [id number] Arrival date & time: 12/30/21  0813      History   Chief Complaint Chief Complaint  Patient presents with   Foot Pain    Possible fracture - Entered by patient    HPI Anne Hall is a 36 y.o. female.  Patient presents with pain in her left foot x 2 days.  The pain started after she stepped wrong and possibly twisted her foot.  The pain was so bad that she felt nauseated.  Some edema then which has resolved.  Treated with ibuprofen yesterday; no OTC medications taken today.  No numbness, weakness, wounds, redness, bruising, or other symptoms.    The history is provided by the patient and medical records.    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 Authorizing Provider  albuterol (PROVENTIL HFA) 108 (90 Base) MCG/ACT inhaler Inhale 2 puffs into the lungs every 6 (six) hours as needed for wheezing or shortness of breath. 02/05/18   Tracey Harries, FNP  cholecalciferol (VITAMIN D3) 25 MCG (1000 UNIT) tablet Take 1,000 Units by mouth daily.    [provider]  magnesium 30 MG tablet Take 30 mg by mouth 2 (two) times daily.    [provider]    Family History Family History  Problem Relation Age of Onset   Miscarriages / Stillbirths Mother    Hyperlipidemia Mother    Hypertension Mother    Asthma Father    Miscarriages / Stillbirths Sister     Breast cancer Maternal Grandmother    Heart disease Maternal Grandmother    Arthritis Paternal Grandmother    Kidney disease Paternal Grandmother    Alcohol abuse Paternal Grandfather    Early death Paternal Grandfather    Diabetes Neg Hx     Social History Social History   Tobacco Use   Smoking status: Never   Smokeless tobacco: Never  Vaping Use   Vaping Use: Never used  Substance Use Topics   Alcohol use: Not Currently   Drug use: No     Allergies   Gluten meal, Milk-related compounds, and Sugar-protein-starch   Review of Systems Review of Systems  Constitutional:  Negative for chills and fever.  Musculoskeletal:  Positive for arthralgias and gait problem. Negative for joint swelling.  Skin:  Negative for color change, rash and wound.  Neurological:  Negative for weakness and numbness.  All other systems reviewed and are negative.    Physical Exam Triage Vital Signs ED Triage Vitals  Enc Vitals Group     BP  Pulse      Resp      Temp      Temp src      SpO2      Weight      Height      Head Circumference      Peak Flow      Pain Score      Pain Loc      Pain Edu?      Excl. in GC?    No data found.  Updated Vital Signs BP 124/82   Pulse 77   Temp 97.9 F (36.6 C)   Resp 18   Ht 5\' 11"  (1.803 m)   Wt 155 lb (70.3 kg)   LMP 12/24/2021   SpO2 98%   BMI 21.62 kg/m   Visual Acuity Right Eye Distance:   Left Eye Distance:   Bilateral Distance:    Right Eye Near:   Left Eye Near:    Bilateral Near:     Physical Exam Vitals and nursing note reviewed.  Constitutional:      General: She is not in acute distress.    Appearance: Normal appearance. She is well-developed. She is not ill-appearing.  HENT:     Mouth/Throat:     Mouth: Mucous membranes are moist.  Cardiovascular:     Rate and Rhythm: Normal rate and regular rhythm.     Heart sounds: Normal heart sounds. No murmur heard. Pulmonary:     Effort: Pulmonary effort is  normal. No respiratory distress.     Breath sounds: Normal breath sounds.  Musculoskeletal:        General: Tenderness present. No swelling or deformity. Normal range of motion.     Cervical back: Neck supple.       Feet:  Skin:    General: Skin is warm and dry.     Capillary Refill: Capillary refill takes less than 2 seconds.     Findings: No bruising, erythema, lesion or rash.  Neurological:     General: No focal deficit present.     Mental Status: She is alert and oriented to person, place, and time.     Sensory: No sensory deficit.     Motor: No weakness.     Gait: Gait normal.  Psychiatric:        Mood and Affect: Mood normal.        Behavior: Behavior normal.      UC Treatments / Results  Labs (all labs ordered are listed, but only abnormal results are displayed) Labs Reviewed - No data to display  EKG   Radiology DG Foot Complete Left  Result Date: 12/30/2021 CLINICAL DATA:  Left foot pain after injury 3 weeks ago. EXAM: LEFT FOOT - COMPLETE 3+ VIEW COMPARISON:  None Available. FINDINGS: There is no evidence of fracture or dislocation. There is no evidence of arthropathy or other focal bone abnormality. Soft tissues are unremarkable. IMPRESSION: Negative. Electronically Signed   By: 03/01/2022 M.D.   On: 12/30/2021 09:09    Procedures Procedures (including critical care time)  Medications Ordered in UC Medications - No data to display  Initial Impression / Assessment and Plan / UC Course  I have reviewed the triage vital signs and the nursing notes.  Pertinent labs & imaging results that were available during my care of the patient were reviewed by me and considered in my medical decision making (see chart for details).    Left foot pain.  X-ray negative.  Treating with ibuprofen, rest, elevation, ice packs.  Instructed patient to follow-up with orthopedics if her symptoms are not improving.  Contact information for on-call Ortho provided.  Education  provided on foot pain.  Patient agrees to plan of care.  Final Clinical Impressions(s) / UC Diagnoses   Final diagnoses:  Left foot pain     Discharge Instructions      Take ibuprofen as needed.  Rest and elevate your foot.  Apply ice packs 2-3 times a day for up to 20 minutes each.      Follow up with an orthopedist if your symptoms are not improving.        ED Prescriptions   None    PDMP not reviewed this encounter.   Mickie Bail, NP 12/30/21 [phone]

## 2021-12-30 NOTE — Discharge Instructions (Addendum)
Take ibuprofen as needed.  Rest and elevate your foot.  Apply ice packs 2-3 times a day for up to 20 minutes each.  Follow up with an orthopedist if your symptoms are not improving.
